# Patient Record
Sex: Female | Born: 1970
Health system: Southern US, Community
[De-identification: ages and names within clinical notes are randomized; demographics above are authoritative.]

## PROBLEM LIST (undated history)

## (undated) DIAGNOSIS — I639 Cerebral infarction, unspecified: Secondary | ICD-10-CM

## (undated) DIAGNOSIS — J45909 Unspecified asthma, uncomplicated: Secondary | ICD-10-CM

## (undated) DIAGNOSIS — E119 Type 2 diabetes mellitus without complications: Secondary | ICD-10-CM

## (undated) HISTORY — DX: Cerebral infarction, unspecified: I63.9

## (undated) HISTORY — PX: LEEP: SHX91

## (undated) HISTORY — DX: Type 2 diabetes mellitus without complications: E11.9

---

## 1999-08-13 ENCOUNTER — Inpatient Hospital Stay (HOSPITAL_COMMUNITY): Admission: AD | Admit: 1999-08-13 | Discharge: 1999-08-13 | Payer: Self-pay | Admitting: Obstetrics and Gynecology

## 1999-08-13 ENCOUNTER — Encounter: Payer: Self-pay | Admitting: Obstetrics

## 1999-10-20 ENCOUNTER — Inpatient Hospital Stay (HOSPITAL_COMMUNITY): Admission: AD | Admit: 1999-10-20 | Discharge: 1999-10-20 | Payer: Self-pay | Admitting: *Deleted

## 1999-10-21 ENCOUNTER — Inpatient Hospital Stay (HOSPITAL_COMMUNITY): Admission: AD | Admit: 1999-10-21 | Discharge: 1999-10-23 | Payer: Self-pay | Admitting: Obstetrics & Gynecology

## 2000-06-08 ENCOUNTER — Emergency Department (HOSPITAL_COMMUNITY): Admission: EM | Admit: 2000-06-08 | Discharge: 2000-06-09 | Payer: Self-pay | Admitting: Emergency Medicine

## 2000-10-29 ENCOUNTER — Other Ambulatory Visit: Admission: RE | Admit: 2000-10-29 | Discharge: 2000-10-29 | Payer: Self-pay | Admitting: Obstetrics and Gynecology

## 2005-05-06 ENCOUNTER — Emergency Department: Payer: Self-pay | Admitting: Unknown Physician Specialty

## 2005-05-06 ENCOUNTER — Other Ambulatory Visit: Payer: Self-pay

## 2005-05-26 ENCOUNTER — Emergency Department: Payer: Self-pay | Admitting: Unknown Physician Specialty

## 2006-03-29 ENCOUNTER — Inpatient Hospital Stay: Payer: Self-pay | Admitting: Obstetrics and Gynecology

## 2008-11-09 ENCOUNTER — Ambulatory Visit: Payer: Self-pay | Admitting: Certified Nurse Midwife

## 2008-11-23 ENCOUNTER — Emergency Department: Payer: Self-pay | Admitting: Emergency Medicine

## 2011-01-24 ENCOUNTER — Emergency Department: Payer: Self-pay | Admitting: Emergency Medicine

## 2011-11-18 ENCOUNTER — Emergency Department: Payer: Self-pay | Admitting: Emergency Medicine

## 2011-11-18 LAB — CBC WITH DIFFERENTIAL/PLATELET
Basophil #: 0.1 10*3/uL (ref 0.0–0.1)
Basophil %: 0.7 %
Eosinophil #: 0.1 10*3/uL (ref 0.0–0.7)
Eosinophil %: 0.7 %
HCT: 39.5 % (ref 35.0–47.0)
HGB: 12.6 g/dL (ref 12.0–16.0)
Lymphocyte #: 2.6 10*3/uL (ref 1.0–3.6)
Lymphocyte %: 18.9 %
MCH: 25.9 pg — ABNORMAL LOW (ref 26.0–34.0)
MCHC: 31.9 g/dL — ABNORMAL LOW (ref 32.0–36.0)
MCV: 81 fL (ref 80–100)
Monocyte #: 1.5 x10 3/mm — ABNORMAL HIGH (ref 0.2–0.9)
Monocyte %: 11.2 %
Neutrophil #: 9.3 10*3/uL — ABNORMAL HIGH (ref 1.4–6.5)
Neutrophil %: 68.5 %
Platelet: 295 10*3/uL (ref 150–440)
RBC: 4.88 10*6/uL (ref 3.80–5.20)
RDW: 12.5 % (ref 11.5–14.5)
WBC: 13.6 10*3/uL — ABNORMAL HIGH (ref 3.6–11.0)

## 2011-11-18 LAB — BASIC METABOLIC PANEL
Anion Gap: 7 (ref 7–16)
BUN: 8 mg/dL (ref 7–18)
Calcium, Total: 8.8 mg/dL (ref 8.5–10.1)
Chloride: 101 mmol/L (ref 98–107)
Co2: 26 mmol/L (ref 21–32)
Creatinine: 1.13 mg/dL (ref 0.60–1.30)
EGFR (African American): >60
EGFR (Non-African Amer.): >60
Glucose: 103 mg/dL — ABNORMAL HIGH (ref 65–99)
Osmolality: 267 (ref 275–301)
Potassium: 3.8 mmol/L (ref 3.5–5.1)
Sodium: 134 mmol/L — ABNORMAL LOW (ref 136–145)

## 2011-11-18 LAB — PREGNANCY, URINE: Pregnancy Test, Urine: NEGATIVE m[IU]/mL

## 2012-11-08 ENCOUNTER — Ambulatory Visit: Payer: Self-pay

## 2012-11-26 ENCOUNTER — Ambulatory Visit: Payer: Self-pay

## 2015-09-26 ENCOUNTER — Emergency Department: Payer: 59

## 2015-09-26 ENCOUNTER — Emergency Department
Admission: EM | Admit: 2015-09-26 | Discharge: 2015-09-26 | Disposition: A | Discharge status: Home / Self Care | Payer: 59 | Attending: Emergency Medicine | Admitting: Emergency Medicine

## 2015-09-26 ENCOUNTER — Encounter: Payer: Self-pay | Admitting: Emergency Medicine

## 2015-09-26 DIAGNOSIS — Y9389 Activity, other specified: Secondary | ICD-10-CM | POA: Insufficient documentation

## 2015-09-26 DIAGNOSIS — Y92410 Unspecified street and highway as the place of occurrence of the external cause: Secondary | ICD-10-CM | POA: Insufficient documentation

## 2015-09-26 DIAGNOSIS — S39012A Strain of muscle, fascia and tendon of lower back, initial encounter: Secondary | ICD-10-CM

## 2015-09-26 DIAGNOSIS — Y999 Unspecified external cause status: Secondary | ICD-10-CM | POA: Insufficient documentation

## 2015-09-26 DIAGNOSIS — M545 Low back pain: Secondary | ICD-10-CM | POA: Diagnosis present

## 2015-09-26 LAB — POCT PREGNANCY, URINE: Preg Test, Ur: NEGATIVE

## 2015-09-26 MED ORDER — IBUPROFEN 800 MG PO TABS: 800.0000 mg | ORAL_TABLET | Freq: Three times a day (TID) | ORAL | Status: DC | PRN

## 2015-09-26 MED ORDER — CYCLOBENZAPRINE HCL 5 MG PO TABS: 5.0000 mg | ORAL_TABLET | Freq: Three times a day (TID) | ORAL | Status: DC | PRN
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
## 2015-09-26 NOTE — Discharge Instructions (Signed)
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
## 2015-10-02 ENCOUNTER — Ambulatory Visit
Admission: EM | Admit: 2015-10-02 | Discharge: 2015-10-02 | Disposition: A | Discharge status: Home / Self Care | Payer: 59 | Attending: Family Medicine | Admitting: Family Medicine

## 2015-10-02 ENCOUNTER — Encounter: Payer: Self-pay | Admitting: *Deleted

## 2015-10-02 DIAGNOSIS — S338XXA Sprain of other parts of lumbar spine and pelvis, initial encounter: Secondary | ICD-10-CM

## 2015-10-02 DIAGNOSIS — S39012D Strain of muscle, fascia and tendon of lower back, subsequent encounter: Secondary | ICD-10-CM

## 2015-10-02 HISTORY — DX: Unspecified asthma, uncomplicated: J45.909
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
## 2015-10-02 NOTE — Discharge Instructions (Signed)
Stretch well, avoid reinjury.   Follow up with your primary care physician this week as needed. Return to Urgent care for new or worsening concerns.

## 2018-04-09 ENCOUNTER — Other Ambulatory Visit: Payer: Self-pay

## 2018-04-09 ENCOUNTER — Inpatient Hospital Stay
Admission: EM | Admit: 2018-04-09 | Discharge: 2018-04-10 | DRG: 066 | Disposition: A | Discharge status: Home / Self Care | Payer: Medicaid Other | Attending: Internal Medicine | Admitting: Internal Medicine

## 2018-04-09 ENCOUNTER — Emergency Department: Payer: Medicaid Other

## 2018-04-09 ENCOUNTER — Encounter: Payer: Self-pay | Admitting: Emergency Medicine

## 2018-04-09 DIAGNOSIS — R29700 NIHSS score 0: Secondary | ICD-10-CM | POA: Diagnosis present

## 2018-04-09 DIAGNOSIS — R29810 Facial weakness: Secondary | ICD-10-CM | POA: Diagnosis present

## 2018-04-09 DIAGNOSIS — Z23 Encounter for immunization: Secondary | ICD-10-CM | POA: Diagnosis not present

## 2018-04-09 DIAGNOSIS — I639 Cerebral infarction, unspecified: Secondary | ICD-10-CM | POA: Diagnosis present

## 2018-04-09 DIAGNOSIS — Z880 Allergy status to penicillin: Secondary | ICD-10-CM | POA: Diagnosis not present

## 2018-04-09 DIAGNOSIS — R739 Hyperglycemia, unspecified: Secondary | ICD-10-CM | POA: Diagnosis present

## 2018-04-09 DIAGNOSIS — J45909 Unspecified asthma, uncomplicated: Secondary | ICD-10-CM | POA: Diagnosis present

## 2018-04-09 DIAGNOSIS — R4701 Aphasia: Secondary | ICD-10-CM | POA: Diagnosis present

## 2018-04-09 DIAGNOSIS — Z79899 Other long term (current) drug therapy: Secondary | ICD-10-CM | POA: Diagnosis not present

## 2018-04-09 LAB — TROPONIN I: Troponin I: <0.03 ng/mL (ref <0.03)

## 2018-04-09 LAB — CBC
HEMATOCRIT: 43.3 % (ref 36.0–46.0)
HEMOGLOBIN: 13.7 g/dL (ref 12.0–15.0)
MCH: 26.6 pg (ref 26.0–34.0)
MCHC: 31.6 g/dL (ref 30.0–36.0)
MCV: 84.1 fL (ref 80.0–100.0)
NRBC: 0 % (ref 0.0–0.2)
Platelets: 215 10*3/uL (ref 150–400)
RBC: 5.15 MIL/uL — ABNORMAL HIGH (ref 3.87–5.11)
RDW: 12.3 % (ref 11.5–15.5)
WBC: 11 10*3/uL — ABNORMAL HIGH (ref 4.0–10.5)

## 2018-04-09 LAB — DIFFERENTIAL
ABS IMMATURE GRANULOCYTES: 0.03 10*3/uL (ref 0.00–0.07)
BASOS ABS: 0 10*3/uL (ref 0.0–0.1)
Basophils Relative: 0 %
Eosinophils Absolute: 0.2 10*3/uL (ref 0.0–0.5)
Eosinophils Relative: 2 %
IMMATURE GRANULOCYTES: 0 %
LYMPHS ABS: 5 10*3/uL — AB (ref 0.7–4.0)
LYMPHS PCT: 45 %
Monocytes Absolute: 1.1 10*3/uL — ABNORMAL HIGH (ref 0.1–1.0)
Monocytes Relative: 10 %
NEUTROS ABS: 4.7 10*3/uL (ref 1.7–7.7)
Neutrophils Relative %: 43 %

## 2018-04-09 LAB — COMPREHENSIVE METABOLIC PANEL
ALBUMIN: 3.9 g/dL (ref 3.5–5.0)
ALK PHOS: 68 U/L (ref 38–126)
ALT: 13 U/L (ref 0–44)
AST: 18 U/L (ref 15–41)
Anion gap: 9 (ref 5–15)
BILIRUBIN TOTAL: 0.9 mg/dL (ref 0.3–1.2)
BUN: 10 mg/dL (ref 6–20)
CALCIUM: 8.8 mg/dL — AB (ref 8.9–10.3)
CO2: 24 mmol/L (ref 22–32)
CREATININE: 0.88 mg/dL (ref 0.44–1.00)
Chloride: 104 mmol/L (ref 98–111)
GFR calc Af Amer: >60 mL/min (ref >60)
GFR calc non Af Amer: >60 mL/min (ref >60)
GLUCOSE: 148 mg/dL — AB (ref 70–99)
Potassium: 3.8 mmol/L (ref 3.5–5.1)
SODIUM: 137 mmol/L (ref 135–145)
TOTAL PROTEIN: 8.1 g/dL (ref 6.5–8.1)

## 2018-04-09 LAB — PROTIME-INR
INR: 0.89
PROTHROMBIN TIME: 12 s (ref 11.4–15.2)

## 2018-04-09 LAB — GLUCOSE, CAPILLARY: Glucose-Capillary: 130 mg/dL — ABNORMAL HIGH (ref 70–99)

## 2018-04-09 LAB — APTT: aPTT: 31 seconds (ref 24–36)

## 2018-04-09 MED ORDER — ACETAMINOPHEN 650 MG RE SUPP: 650.0000 mg | RECTAL | Status: DC | PRN

## 2018-04-09 MED ORDER — VITAMIN D 25 MCG (1000 UNIT) PO TABS
1000.0000 [IU] | ORAL_TABLET | Freq: Every day | ORAL | Status: DC
  Administered 2018-04-10: 1000 [IU] via ORAL

## 2018-04-09 MED ORDER — ONDANSETRON HCL 4 MG/2ML IJ SOLN: 4.0000 mg | Freq: Four times a day (QID) | INTRAMUSCULAR | Status: DC | PRN

## 2018-04-09 MED ORDER — ACETAMINOPHEN 160 MG/5ML PO SOLN
650.0000 mg | ORAL | Status: DC | PRN
  Filled 2018-04-09: qty 20.3

## 2018-04-09 MED ORDER — INFLUENZA VAC SPLIT QUAD 0.5 ML IM SUSY
0.5000 mL | PREFILLED_SYRINGE | INTRAMUSCULAR | Status: AC
  Administered 2018-04-10: 0.5 mL via INTRAMUSCULAR
  Filled 2018-04-09: qty 0.5

## 2018-04-09 MED ORDER — ASPIRIN 81 MG PO CHEW
162.0000 mg | CHEWABLE_TABLET | Freq: Once | ORAL | Status: AC
  Administered 2018-04-09: 162 mg via ORAL
  Filled 2018-04-09: qty 2

## 2018-04-09 MED ORDER — PNEUMOCOCCAL VAC POLYVALENT 25 MCG/0.5ML IJ INJ
0.5000 mL | INJECTION | INTRAMUSCULAR | Status: AC
  Administered 2018-04-10: 0.5 mL via INTRAMUSCULAR
  Filled 2018-04-09: qty 0.5

## 2018-04-09 MED ORDER — ASPIRIN 81 MG PO CHEW
81.0000 mg | CHEWABLE_TABLET | Freq: Every day | ORAL | Status: DC
  Administered 2018-04-10: 81 mg via ORAL
  Filled 2018-04-09: qty 1

## 2018-04-09 MED ORDER — ACETAMINOPHEN 325 MG PO TABS: 650.0000 mg | ORAL_TABLET | ORAL | Status: DC | PRN

## 2018-04-09 MED ORDER — SENNOSIDES-DOCUSATE SODIUM 8.6-50 MG PO TABS: 1.0000 | ORAL_TABLET | Freq: Every evening | ORAL | Status: DC | PRN

## 2018-04-09 MED ORDER — BISACODYL 5 MG PO TBEC: 5.0000 mg | DELAYED_RELEASE_TABLET | Freq: Every day | ORAL | Status: DC | PRN

## 2018-04-09 MED ORDER — ENOXAPARIN SODIUM 40 MG/0.4ML ~~LOC~~ SOLN
40.0000 mg | SUBCUTANEOUS | Status: DC
  Administered 2018-04-09: 40 mg via SUBCUTANEOUS
  Filled 2018-04-09: qty 0.4

## 2018-04-09 MED ORDER — STROKE: EARLY STAGES OF RECOVERY BOOK
Freq: Once | Status: AC
  Administered 2018-04-09: 23:00:00

## 2018-04-09 NOTE — ED Notes (Signed)
First Nurse Note: facial droop and slurred speech that started 30 minutes PTA

## 2018-04-09 NOTE — ED Notes (Signed)
cbg 130 

## 2018-04-09 NOTE — ED Triage Notes (Signed)
PT c/o facial droop and slurred speech x30 min. LKW was aprox 1600.

## 2018-04-09 NOTE — ED Provider Notes (Addendum)
[facility] Emergency Department Provider Note ____________________________________________   First MD Initiated Contact with Patient 04/09/18 1632     (approximate)  I have reviewed the triage vital signs and the nursing notes.   HISTORY  Chief Complaint Facial Droop    HPI Stacie Flores is a 47 y.o. female with history of asthma who presents with difficulty speaking, acute onset at 4 PM today, lasting less than 30 minutes, and associated with a possible facial droop and with tingling around her mouth and face.  The patient states that after the symptoms started she went to a store and outs were somewhat noted that her face looked twisted.  She denies any weakness or numbness throughout the rest of her body.  She does report some headache.  She states earlier today she had sort of an out of body or foggy type sensation which resolved prior to the current symptoms.  No prior history of episodes like this.  Past Medical History:  Diagnosis Date  . Asthma     Patient Active Problem List   Diagnosis Date Noted  . Stroke [facility]) 04/09/2018    Past Surgical History:  Procedure Laterality Date  . CESAREAN SECTION    . LEEP      Prior to Admission medications   Medication Sig Start Date End Date Taking? Authorizing Provider  Ascorbic Acid (VITAMIN C) 1000 MG tablet Take 1,000 mg by mouth daily.   Yes [provider]  Biotin 1000 MCG tablet Take 1,000 mcg by mouth daily.   Yes [provider]  cholecalciferol (VITAMIN D3) 25 MCG (1000 UT) tablet Take 1,000 Units by mouth daily.   Yes [provider]    Allergies Penicillins  History reviewed. No pertinent family history.  Social History Social History   Tobacco Use  . Smoking status: Never Smoker  . Smokeless tobacco: Never Used  Substance Use Topics  . Alcohol use: No  . Drug use: No    Review of Systems  Constitutional: No fever. Eyes: No visual changes. ENT: No  sore throat. Cardiovascular: Denies chest pain. Respiratory: Denies shortness of breath. Gastrointestinal: No vomiting. Genitourinary: Negative for flank pain.  Musculoskeletal: Negative for back pain. Skin: Negative for rash. Neurological: Negative for focal weakness or numbness.   ____________________________________________   PHYSICAL EXAM:  VITAL SIGNS: ED Triage Vitals [04/09/18 1623]  Enc Vitals Group     BP (!) 142/101     Pulse Rate 78     Resp 16     Temp      Temp src      SpO2 95 %     Weight      Height      Head Circumference      Peak Flow      Pain Score 0     Pain Loc      Pain Edu?      Excl. in GC?     Constitutional: Alert and oriented.  Slightly anxious appearing but in no acute distress. Eyes: Conjunctivae are normal.  EOMI.  PERRLA. Head: Atraumatic. Nose: No congestion/rhinnorhea. Mouth/Throat: Mucous membranes are moist.   Neck: Normal range of motion.  Cardiovascular: Normal rate, regular rhythm. Grossly normal heart sounds.  Good peripheral circulation. Respiratory: Normal respiratory effort.  No retractions. Lungs CTAB. Gastrointestinal: No distention.  Musculoskeletal: Extremities warm and well perfused.  Neurologic:  Normal speech and language.  No facial droop or abnormal sensation to the face.  Motor and  sensory intact in all extremities.  Normal coordination.  Skin:  Skin is warm and dry. No rash noted. Psychiatric: Mood and affect are normal. Speech and behavior are normal.  ____________________________________________   LABS (all labs ordered are listed, but only abnormal results are displayed)  Labs Reviewed  CBC - Abnormal; Notable for the following components:      Result Value   WBC 11.0 (*)    RBC 5.15 (*)    All other components within normal limits  DIFFERENTIAL - Abnormal; Notable for the following components:   Lymphs Abs 5.0 (*)    Monocytes Absolute 1.1 (*)    All other components within normal limits    COMPREHENSIVE METABOLIC PANEL - Abnormal; Notable for the following components:   Glucose, Bld 148 (*)    Calcium 8.8 (*)    All other components within normal limits  GLUCOSE, CAPILLARY - Abnormal; Notable for the following components:   Glucose-Capillary 130 (*)    All other components within normal limits  PROTIME-INR  APTT  TROPONIN I  CBG MONITORING, ED  POC URINE PREG, ED   ____________________________________________  EKG  ED ECG REPORT I, Dionne Bucy, the attending physician, personally viewed and interpreted this ECG.  Date: 04/09/2018 EKG Time:  Rate:  Rhythm:  QRS Axis:  Intervals:  ST/T Wave abnormalities:  Narrative Interpretation:   ____________________________________________  RADIOLOGY  CT head: No ICH or other acute findings MRI brain: Small right insular acute infarct versus artifact ____________________________________________   PROCEDURES  Procedure(s) performed: No  Procedures  Critical Care performed: Yes  CRITICAL CARE Performed by: Dionne Bucy   Total critical care time: 20 minutes  Critical care time was exclusive of separately billable procedures and treating other patients.  Critical care was necessary to treat or prevent imminent or life-threatening deterioration.  Critical care was time spent personally by me on the following activities: development of treatment plan with patient and/or surrogate as well as nursing, discussions with consultants, evaluation of patient's response to treatment, examination of patient, obtaining history from patient or surrogate, ordering and performing treatments and interventions, ordering and review of laboratory studies, ordering and review of radiographic studies, pulse oximetry and re-evaluation of patient's condition.     ____________________________________________   INITIAL IMPRESSION / ASSESSMENT AND PLAN / ED COURSE  Pertinent labs & imaging results that were  available during my care of the patient were reviewed by me and considered in my medical decision making (see chart for details).  47 year old female with PMH as noted above presents with acute onset of difficulty speaking and a possible facial droop or "twisting" of her face which has now resolved.  No associated weakness or numbness in her extremities or other acute neurologic symptoms.  On exam, the patient is relatively well-appearing and her vital signs are normal except for slight hypertension.  Her neurologic exam is normal at this time.  Code stroke was activated from triage.  CT head is negative.  The patient is now undergoing tele-neurology evaluation and lab work-up.  Overall my suspicion for acute stroke is low given the patient's age, lack of risk factors, the symptoms limited to speech and face without a clear unilateral facial droop, and the fact that they have now resolved.  ----------------------------------------- 9:51 PM on 04/09/2018 -----------------------------------------  Tele-neurology advised that tPA was not indicated and recommended TIA work-up and possible admission.  I discussed this with the patient and given her overall low risk for stroke, we agreed to obtain an  MRI in the ED and if it was negative and the patient remained asymptomatic we could discharge her home.  However, the MRI does show a small acute lesion which appears to be an infarct in the right insula.  This certainly could be consistent with the patient's primary symptom of speech disturbance.  Given this finding, the patient will require admission for further work-up and risk stratification.  I signed the patient out to the hospitalist Dr. Marjie Skiff. ____________________________________________   FINAL CLINICAL IMPRESSION(S) / ED DIAGNOSES  Final diagnoses:  Cerebrovascular accident (CVA), unspecified mechanism (HCC)      NEW MEDICATIONS STARTED DURING THIS VISIT:  New Prescriptions   No  medications on file     Note:  This document was prepared using Dragon voice recognition software and may include unintentional dictation errors.    Dionne Bucy, MD 04/09/18 2152    Dionne Bucy, MD 04/27/18 1539

## 2018-04-09 NOTE — ED Notes (Signed)
Patient transported to MRI 

## 2018-04-09 NOTE — Consult Note (Signed)
[facility] TeleNeurology Consult Services   Date of Service:   04/09/2018 16:38:12  Impression:     .  RO Acute Ischemic Stroke  Comments: 47 year old female with transient slurred speech. Concern for possible TIA.  Mechanism of Stroke: Not Clear  Metrics: Last Known Well: 04/09/2018 15:45:00 [facility] Notification Time: 04/09/2018 16:38:12 Arrival Time: 04/09/2018 16:19:00 Stamp Time: 04/09/2018 16:38:12 Time First Login Attempt: 04/09/2018 16:47:00 Video Start Time: 04/09/2018 16:47:00  Symptoms: Slurred speech NIHSS Start Assessment Time: 04/09/2018 16:49:02 Patient is not a candidate for tPA. Patient was not deemed candidate for tPA thrombolytics because of NIHSS of 0. Video End Time: 04/09/2018 16:58:25  CT head was reviewed.  Advanced imaging was not obtained as the presentation was not suggestive of Large Vessel Occlusive Disease.   Radiologist was not called back for review of advanced imaging because Symptoms not consistent with LVO ER Physician notified of the decision on thrombolytics management on 04/09/2018 16:58:28  Our recommendations are outlined below.  Recommendations:     .  Activate Stroke Protocol Admission/Order Set     .  Stroke/Telemetry Floor     .  Neuro Checks     .  Bedside Swallow Eval     .  DVT Prophylaxis     .  IV Fluids, Normal Saline     .  Head of Bed Below 30 Degrees     .  Euglycemia and Avoid Hyperthermia (PRN Acetaminophen)     .  Antiplatelet Therapy Recommended  Routine Consultation with Inhouse Neurology for Follow up Care  Sign Out:     .  Discussed with Emergency Department Provider    ------------------------------------------------------------------------------  History of Present Illness: Patient is a 47 year old Female.  Patient was brought by EMS for symptoms of Slurred speech  47 year old female with no significant past medical history who presents with transient slurred  speech and right side weakness. Patient had just walked into a gas station and was trying to speak with the attendant and could not form her words correctly. She felt the right side of her face was numb and just did not feel right on the right side.  CT head was reviewed.    Examination: 1A: Level of Consciousness - Alert; keenly responsive + 0 1B: Ask Month and Age - Both Questions Right + 0 1C: Blink Eyes & Squeeze Hands - Performs Both Tasks + 0 2: Test Horizontal Extraocular Movements - Normal + 0 3: Test Visual Fields - No Visual Loss + 0 4: Test Facial Palsy (Use Grimace if Obtunded) - Normal symmetry + 0 5A: Test Left Arm Motor Drift - No Drift for 10 Seconds + 0 5B: Test Right Arm Motor Drift - No Drift for 10 Seconds + 0 6A: Test Left Leg Motor Drift - No Drift for 5 Seconds + 0 6B: Test Right Leg Motor Drift - No Drift for 5 Seconds + 0 7: Test Limb Ataxia (FNF/Heel-Shin) - No Ataxia + 0 8: Test Sensation - Normal; No sensory loss + 0 9: Test Language/Aphasia - Normal; No aphasia + 0 10: Test Dysarthria - Normal + 0 11: Test Extinction/Inattention - No abnormality + 0  NIHSS Score: 0  Patient was informed the Neurology Consult would happen via TeleHealth consult by way of interactive audio and video telecommunications and consented to receiving care in this manner.  Due to the immediate potential for life-threatening deterioration due to underlying acute neurologic illness, I spent 35 minutes  providing critical care. This time includes time for face to face visit via telemedicine, review of medical records, imaging studies and discussion of findings with providers, the patient and/or family.   Dr Joice Lofts   [facility] [phone]

## 2018-04-09 NOTE — ED Notes (Signed)
Admitting MD at bedside.

## 2018-04-10 ENCOUNTER — Inpatient Hospital Stay
Admit: 2018-04-10 | Discharge: 2018-04-10 | Disposition: A | Discharge status: Home / Self Care | Payer: Medicaid Other | Attending: Internal Medicine | Admitting: Internal Medicine

## 2018-04-10 ENCOUNTER — Inpatient Hospital Stay: Payer: Medicaid Other

## 2018-04-10 LAB — ECHOCARDIOGRAM COMPLETE
Height: 61 in
Weight: 2208 oz

## 2018-04-10 LAB — LIPID PANEL
CHOL/HDL RATIO: 2.8 ratio
CHOLESTEROL: 159 mg/dL (ref 0–200)
HDL: 56 mg/dL (ref >40)
LDL Cholesterol: 63 mg/dL (ref 0–99)
Triglycerides: 199 mg/dL — ABNORMAL HIGH (ref <150)
VLDL: 40 mg/dL (ref 0–40)

## 2018-04-10 LAB — HEMOGLOBIN A1C
Hgb A1c MFr Bld: 7 % — ABNORMAL HIGH (ref 4.8–5.6)
Mean Plasma Glucose: 154.2 mg/dL

## 2018-04-10 MED ORDER — ASPIRIN 81 MG PO CHEW: 81.0000 mg | CHEWABLE_TABLET | Freq: Every day | ORAL | 0 refills | Status: DC

## 2018-04-10 NOTE — Progress Notes (Signed)
Admitted for acute stroke on the right side, patient had left right facial droop as the main complaint.  Ultrasound of carotids, echocardiogram did not show any acute problem.  Stable for discharge, advised her to continue aspirin.  No focal neurological deficit today.  We will discharge the patient, discharge instructions on the computer.

## 2018-04-10 NOTE — Plan of Care (Signed)
  Problem: Education: Goal: Knowledge of General Education information will improve Description: Including pain rating scale, medication(s)/side effects and non-pharmacologic comfort measures Outcome: Progressing   Problem: Health Behavior/Discharge Planning: Goal: Ability to manage health-related needs will improve Outcome: Progressing   Problem: Clinical Measurements: Goal: Ability to maintain clinical measurements within normal limits will improve Outcome: Progressing Goal: Will remain free from infection Outcome: Progressing Goal: Diagnostic test results will improve Outcome: Progressing Goal: Respiratory complications will improve Outcome: Progressing Goal: Cardiovascular complication will be avoided Outcome: Progressing   Problem: Activity: Goal: Risk for activity intolerance will decrease Outcome: Progressing   Problem: Nutrition: Goal: Adequate nutrition will be maintained Outcome: Progressing   Problem: Coping: Goal: Level of anxiety will decrease Outcome: Progressing   Problem: Elimination: Goal: Will not experience complications related to bowel motility Outcome: Progressing Goal: Will not experience complications related to urinary retention Outcome: Progressing   Problem: Pain Managment: Goal: General experience of comfort will improve Outcome: Progressing   Problem: Safety: Goal: Ability to remain free from injury will improve Outcome: Progressing   Problem: Skin Integrity: Goal: Risk for impaired skin integrity will decrease Outcome: Progressing   Problem: Education: Goal: Knowledge of disease or condition will improve Outcome: Progressing Goal: Knowledge of secondary prevention will improve Outcome: Progressing Goal: Knowledge of patient specific risk factors addressed and post discharge goals established will improve Outcome: Progressing Goal: Individualized Educational Video(s) Outcome: Progressing   Problem: Coping: Goal: Will verbalize  positive feelings about self Outcome: Progressing Goal: Will identify appropriate support needs Outcome: Progressing   Problem: Health Behavior/Discharge Planning: Goal: Ability to manage health-related needs will improve Outcome: Progressing   Problem: Self-Care: Goal: Ability to participate in self-care as condition permits will improve Outcome: Progressing Goal: Verbalization of feelings and concerns over difficulty with self-care will improve Outcome: Progressing Goal: Ability to communicate needs accurately will improve Outcome: Progressing   Problem: Nutrition: Goal: Risk of aspiration will decrease Outcome: Progressing Goal: Dietary intake will improve Outcome: Progressing   Problem: Intracerebral Hemorrhage Tissue Perfusion: Goal: Complications of Intracerebral Hemorrhage will be minimized Outcome: Progressing   Problem: Ischemic Stroke/TIA Tissue Perfusion: Goal: Complications of ischemic stroke/TIA will be minimized Outcome: Progressing   

## 2018-04-10 NOTE — H&P (Signed)
[facility] [place] [facility]   PATIENT NAME: Stacie Flores    MR#:  [id number]  DATE OF BIRTH:  12-10-1970  DATE OF ADMISSION:  04/09/2018  PRIMARY CARE PHYSICIAN: Lyndon Code, MD   REQUESTING/REFERRING PHYSICIAN: Dionne Bucy, MD  CHIEF COMPLAINT:   Chief Complaint  Patient presents with  . Facial Droop    HISTORY OF PRESENT ILLNESS:  Stacie Flores  is a 47 y.o. female with a known history of asthma p/w transient neurological abnl/deficits. Endorses uneventful day. Went to gas station in afternoon. Was requesting someone fix her radiator. Was asked what kind of car she had, and was unable to answer properly. States she knew the answer in her head, but she could not get the words out. Expressive aphasia transient, < , resolved spontaneously. States she got in car and went to [employer] to see friend. Friend told her that her face was "twisted". Possible slurred speech, facial droop. Face "felt funny." Friend called EMS. Neurologically non-focal at the time of my exam. MRI brain (+) "Subcentimeter acute right insular infarct versus artifact."  PAST MEDICAL HISTORY:   Past Medical History:  Diagnosis Date  . Asthma     PAST SURGICAL HISTORY:   Past Surgical History:  Procedure Laterality Date  . CESAREAN SECTION    . LEEP      SOCIAL HISTORY:   Social History   Tobacco Use  . Smoking status: Never Smoker  . Smokeless tobacco: Never Used  Substance Use Topics  . Alcohol use: No    FAMILY HISTORY:  History reviewed. No pertinent family history.  DRUG ALLERGIES:   Allergies  Allergen Reactions  . Penicillins Hives    Has patient had a PCN reaction causing immediate rash, facial/tongue/throat swelling, SOB or lightheadedness with hypotension: Yes Has patient had a PCN reaction causing severe rash involving mucus membranes or skin necrosis: No Has patient had a PCN reaction that required hospitalization: No Has patient had a PCN  reaction occurring within the last 10 years: Yes If all of the above answers are "NO", then may proceed with Cephalosporin use.    REVIEW OF SYSTEMS:   Review of Systems  Constitutional: Negative for chills, diaphoresis, fever, malaise/fatigue and weight loss.  HENT: Negative for congestion, ear pain, hearing loss, nosebleeds, sinus pain, sore throat and tinnitus.   Eyes: Negative for blurred vision, double vision and photophobia.  Respiratory: Negative for cough, hemoptysis, sputum production, shortness of breath and wheezing.   Cardiovascular: Negative for chest pain, palpitations, orthopnea, claudication, leg swelling and PND.  Gastrointestinal: Negative for abdominal pain, blood in stool, constipation, diarrhea, heartburn, melena, nausea and vomiting.  Genitourinary: Negative for dysuria, frequency, hematuria and urgency.  Musculoskeletal: Negative for back pain, falls, joint pain, myalgias and neck pain.  Skin: Negative for itching and rash.  Neurological: Positive for sensory change and speech change. Negative for dizziness, tingling, tremors, focal weakness, seizures, loss of consciousness, weakness and headaches.  Psychiatric/Behavioral: Negative for memory loss. The patient does not have insomnia.    MEDICATIONS AT HOME:   Prior to Admission medications   Medication Sig Start Date End Date Taking? Authorizing Provider  Ascorbic Acid (VITAMIN C) 1000 MG tablet Take 1,000 mg by mouth daily.   Yes [provider]  Biotin 1000 MCG tablet Take 1,000 mcg by mouth daily.   Yes [provider]  cholecalciferol (VITAMIN D3) 25 MCG (1000 UT) tablet Take 1,000 Units by mouth daily.   Yes  [provider]      VITAL SIGNS:  Blood pressure (!) 101/58, pulse 69, temperature 98.5 F (36.9 C), temperature source Oral, resp. rate 16, height 5\' 1"  (1.549 m), weight 62.6 kg, last menstrual period 03/21/2018, SpO2 98 %.  PHYSICAL EXAMINATION:  Physical Exam    Constitutional: She is oriented to person, place, and time. She appears well-developed and well-nourished. She is active and cooperative.  Non-toxic appearance. She does not have a sickly appearance. She does not appear ill. No distress. She is not intubated.  HENT:  Head: Normocephalic and atraumatic.  Mouth/Throat: Oropharynx is clear and moist. No oropharyngeal exudate.  Eyes: Conjunctivae, EOM and lids are normal. No scleral icterus.  Neck: Neck supple. No JVD present. No thyromegaly present.  Cardiovascular: Normal rate, regular rhythm, S1 normal, S2 normal and normal heart sounds.  No extrasystoles are present. Exam reveals no gallop, no S3, no S4, no distant heart sounds and no friction rub.  No murmur heard. Pulmonary/Chest: Effort normal and breath sounds normal. No accessory muscle usage or stridor. No apnea, no tachypnea and no bradypnea. She is not intubated. No respiratory distress. She has no decreased breath sounds. She has no wheezes. She has no rhonchi. She has no rales.  Abdominal: Soft. She exhibits no distension. Bowel sounds are decreased. There is no tenderness. There is no rigidity, no rebound and no guarding.  Musculoskeletal: Normal range of motion. She exhibits no edema or tenderness.  Lymphadenopathy:    She has no cervical adenopathy.  Neurological: She is alert and oriented to person, place, and time. She has normal strength and normal reflexes. She is not disoriented. She displays normal reflexes. No cranial nerve deficit or sensory deficit. She exhibits normal muscle tone. Coordination normal.  5/5 strength in all extremities, preserved sensation to crude touch, CN/reflex/cerebellar exams WNL.  Skin: Skin is warm, dry and intact. No rash noted. She is not diaphoretic. No erythema.  Psychiatric: She has a normal mood and affect. Her speech is normal and behavior is normal. Judgment and thought content normal. Cognition and memory are normal.   LABORATORY PANEL:    CBC Recent Labs  Lab 04/09/18 1625  WBC 11.0*  HGB 13.7  HCT 43.3  PLT 215   ------------------------------------------------------------------------------------------------------------------  Chemistries  Recent Labs  Lab 04/09/18 1625  NA 137  K 3.8  CL 104  CO2 24  GLUCOSE 148*  BUN 10  CREATININE 0.88  CALCIUM 8.8*  AST 18  ALT 13  ALKPHOS 68  BILITOT 0.9   ------------------------------------------------------------------------------------------------------------------  Cardiac Enzymes Recent Labs  Lab 04/09/18 1625  TROPONINI <0.03   ------------------------------------------------------------------------------------------------------------------  RADIOLOGY:  Mr Brain Wo Contrast  Result Date: 04/09/2018 CLINICAL DATA:  Slurred speech and RIGHT-sided weakness at gas station. EXAM: MRI HEAD WITHOUT CONTRAST TECHNIQUE: Multiplanar, multiecho pulse sequences of the brain and surrounding structures were obtained without intravenous contrast. COMPARISON:  CT HEAD April 09, 2018. FINDINGS: INTRACRANIAL CONTENTS: Subcentimeter focus of reduced diffusion anterior RIGHT insula and low ADC values without corroborated signal abnormality. No susceptibility artifact to suggest hemorrhage. The ventricles and sulci are normal for patient's age. No suspicious parenchymal signal, masses, mass effect. No abnormal extra-axial fluid collections. No extra-axial masses. VASCULAR: Normal major intracranial vascular flow voids present at skull base. SKULL AND UPPER CERVICAL SPINE: No abnormal sellar expansion. No suspicious calvarial bone marrow signal. Craniocervical junction maintained. SINUSES/ORBITS: The mastoid air-cells and included paranasal sinuses are well-aerated.The included ocular globes and orbital contents are non-suspicious. OTHER: None. IMPRESSION:  1. Subcentimeter acute RIGHT insular infarct versus artifact. 2. Otherwise negative noncontrast MRI head. Electronically  Signed   By: Awilda Metro M.D.   On: 04/09/2018 19:51   Ct Head Code Stroke Wo Contrast  Result Date: 04/09/2018 CLINICAL DATA:  Code stroke. Facial droop and slurred speech for 30 minutes. EXAM: CT HEAD WITHOUT CONTRAST TECHNIQUE: Contiguous axial images were obtained from the base of the skull through the vertex without intravenous contrast. COMPARISON:  None. FINDINGS: Brain: No acute infarct, hemorrhage, or mass lesion is present. No significant white matter disease is present. Basal ganglia are intact. Insular ribbon is normal. The brainstem and cerebellum is within normal limits. There is no significant extra-axial fluid collection. Vascular: No hyperdense vessel or unexpected calcification. Skull: Calvarium is intact. No focal lytic or blastic lesions are present. Sinuses/Orbits: The paranasal sinuses and mastoid air cells are clear. ASPECTS [facility] Stroke Program Early CT Score) - Ganglionic level infarction (caudate, lentiform nuclei, internal capsule, insula, M1-M3 cortex): 7/7 - Supraganglionic infarction (M4-M6 cortex): 3/3 Total score (0-10 with 10 being normal): 10/10 IMPRESSION: 1. Normal CT of the head without contrast 2. ASPECTS is 10/10 These results were called by telephone at the time of interpretation on 04/09/2018 at 4:38 pm to Dr. Mayford Knife , who verbally acknowledged these results. Electronically Signed   By: Marin Roberts M.D.   On: 04/09/2018 16:38   IMPRESSION AND PLAN:   A/P: 68F w/ transient neurological abn/deficits, MRI (+) "Subcentimeter acute right insular infarct versus artifact." Hyperglycemia, hypocalcemia, leukocytosis. -Transient neurological abnl, acute CVA: Pt p/w 1d Hx transient aphasia, possible slurred speech/facialy droop. MRI brain performed in ED demonstrates "Subcentimeter acute right insular infarct versus artifact." CVA pathway, NIHSS, neuro checks. UJW11. A1c, lipids for risk stratification/modification. Carotid U/S, Echo pending. No P/T, O/T or  SLP services ordered. Lifelong non-smoker, (-) HTN. Continuous cardiac monitoring. -Hyperglycemia: HbA1c. -Hypocalcemia: Ionized calcium. -c/w home meds. -FEN/GI: Regular diet. -DVT PPx: Lovenox. -Code status: Full code. -Disposition: Admission, > 2 midnights.   All the records are reviewed and case discussed with ED provider. Management plans discussed with the patient, family and they are in agreement.  CODE STATUS: Full code.  TOTAL TIME TAKING CARE OF THIS PATIENT: 75 minutes.    Barbaraann Rondo M.D on 04/10/2018 at 2:20 AM  Between 7am to 6pm - Pager - [phone]  After 6pm go to [email or web address] - password [email or web address] [facility]  [facility] [place] [facility]  Office  [phone]  CC: Primary care physician; Lyndon Code, MD   Note: This dictation was prepared with Dragon dictation along with smaller phrase technology. Any transcriptional errors that result from this process are unintentional.

## 2018-04-12 LAB — CALCIUM, IONIZED: Calcium, Ionized, Serum: 4.8 mg/dL (ref 4.5–5.6)

## 2018-04-15 NOTE — Discharge Summary (Signed)
Stacie Flores, is a 47 y.o. female  DOB 11/23/1970  MRN [id number].  Admission date:  04/09/2018  Admitting Physician  Barbaraann RondoPrasanna Sridharan, MD  Discharge Date:  04/10/2018   Primary MD  Lyndon CodeKhan, Fozia M, MD  Recommendations for primary care physician for things to follow:   Follow-up with PCP in 1 week   Admission Diagnosis  Cerebrovascular accident (CVA), unspecified mechanism (HCC) [I63.9]   Discharge Diagnosis  Cerebrovascular accident (CVA), unspecified mechanism (HCC) [I63.9]    Active Problems:   Stroke [facility]HCC)      Past Medical History:  Diagnosis Date  . Asthma     Past Surgical History:  Procedure Laterality Date  . CESAREAN SECTION    . LEEP         History of present illness and  Hospital Course:     Kindly see H&P for history of present illness and admission details, please review complete Labs, Consult reports and Test reports for all details in brief  HPI  from the history and physical done on the day of admission 47 year old female with history of asthma came in because of facial droop, unable to get the words out.  Admitted for stroke evaluation.   Hospital Course  Transient left facial droop, expressive aphasia: Telemetry neurology recommended observation first stroke evaluation.  Initial CT head did not see acute changes, MRI of the brain performed in the emergency room showed subcentimeter acute right insular infarct versus artifact.  Patient admitted to stroke unit under observation, received aspirin, ultrasound of carotids, echocardiogram.  Patient LDL is 63, ultrasound did not show any hemodynamically significant stenosis, echocardiogram showed EF 50 to 55%.  Patient did not have further expressive aphasia or facial droop resolved by the time I saw the patient next day.  Discharged home with  aspirin.  No neurological deficit at discharge.     Discharge Condition: Stable   Follow UP  Follow-up Information    Lyndon CodeKhan, Fozia M, MD. Schedule an appointment as soon as possible for a visit.   Specialty:  Internal Medicine Why:  Can call PCP Monday and make the appointment. Contact information: [address] [place] [id number] [phone]             Discharge Instructions  and  Discharge Medications      Allergies as of 04/10/2018      Reactions   Penicillins Hives   Has patient had a PCN reaction causing immediate rash, facial/tongue/throat swelling, SOB or lightheadedness with hypotension: Yes Has patient had a PCN reaction causing severe rash involving mucus membranes or skin necrosis: No Has patient had a PCN reaction that required hospitalization: No Has patient had a PCN reaction occurring within the last 10 years: Yes If all of the above answers are "NO", then may proceed with Cephalosporin use.      Medication List    TAKE these medications   aspirin 81 MG chewable tablet Chew 1 tablet (81 mg total) by mouth daily.   Biotin 1000 MCG tablet Take 1,000 mcg by mouth daily.   cholecalciferol 25 MCG (1000 UT) tablet Commonly known as:  VITAMIN D3 Take 1,000 Units by mouth daily.   vitamin C 1000 MG tablet Take 1,000 mg by mouth daily.         Diet and Activity recommendation: See Discharge Instructions above   Consults obtained -tele neurology   Major procedures and Radiology Reports - PLEASE review detailed and final reports for all details, in brief -  Mr Brain Wo Contrast  Result Date: 04/09/2018 CLINICAL DATA:  Slurred speech and RIGHT-sided weakness at gas station. EXAM: MRI HEAD WITHOUT CONTRAST TECHNIQUE: Multiplanar, multiecho pulse sequences of the brain and surrounding structures were obtained without intravenous contrast. COMPARISON:  CT HEAD April 09, 2018. FINDINGS: INTRACRANIAL CONTENTS: Subcentimeter focus of  reduced diffusion anterior RIGHT insula and low ADC values without corroborated signal abnormality. No susceptibility artifact to suggest hemorrhage. The ventricles and sulci are normal for patient's age. No suspicious parenchymal signal, masses, mass effect. No abnormal extra-axial fluid collections. No extra-axial masses. VASCULAR: Normal major intracranial vascular flow voids present at skull base. SKULL AND UPPER CERVICAL SPINE: No abnormal sellar expansion. No suspicious calvarial bone marrow signal. Craniocervical junction maintained. SINUSES/ORBITS: The mastoid air-cells and included paranasal sinuses are well-aerated.The included ocular globes and orbital contents are non-suspicious. OTHER: None. IMPRESSION: 1. Subcentimeter acute RIGHT insular infarct versus artifact. 2. Otherwise negative noncontrast MRI head. Electronically Signed   By: Awilda Metro M.D.   On: 04/09/2018 19:51   US Carotid Bilateral (at [facility] Only)  Result Date: 04/10/2018 CLINICAL DATA:  Stroke. EXAM: BILATERAL CAROTID DUPLEX ULTRASOUND TECHNIQUE: Wallace Cullens scale imaging, color Doppler and duplex ultrasound were performed of bilateral carotid and vertebral arteries in the neck. COMPARISON:  None. FINDINGS: Criteria: Quantification of carotid stenosis is based on velocity parameters that correlate the residual internal carotid diameter with NASCET-based stenosis levels, using the diameter of the distal internal carotid lumen as the denominator for stenosis measurement. The following velocity measurements were obtained: RIGHT ICA: 86 cm/sec CCA: 138 cm/sec SYSTOLIC ICA/CCA RATIO:  0.6 ECA: 153 cm/sec LEFT ICA: 127 cm/sec CCA: 143 cm/sec SYSTOLIC ICA/CCA RATIO:  0.9 ECA: 112 cm/sec RIGHT CAROTID ARTERY: Right carotid arteries are patent without significant plaque or stenosis. Normal waveforms and velocities in the internal carotid artery. External carotid artery is patent with normal waveform. RIGHT VERTEBRAL ARTERY: Antegrade  flow and normal waveform in the right vertebral artery. LEFT CAROTID ARTERY: Left carotid arteries are patent without significant plaque or stenosis. External carotid artery is patent with normal waveform. Normal waveforms and velocities in the internal carotid artery. LEFT VERTEBRAL ARTERY: Antegrade flow and normal waveform in the left vertebral artery. IMPRESSION: Normal carotid artery duplex. Carotid arteries are patent without significant plaque or stenosis. Patent vertebral arteries. Electronically Signed   By: Richarda Overlie M.D.   On: 04/10/2018 09:55   Ct Head Code Stroke Wo Contrast  Result Date: 04/09/2018 CLINICAL DATA:  Code stroke. Facial droop and slurred speech for 30 minutes. EXAM: CT HEAD WITHOUT CONTRAST TECHNIQUE: Contiguous axial images were obtained from the base of the skull through the vertex without intravenous contrast. COMPARISON:  None. FINDINGS: Brain: No acute infarct, hemorrhage, or mass lesion is present. No significant white matter disease is present. Basal ganglia are intact. Insular ribbon is normal. The brainstem and cerebellum is within normal limits. There is no significant extra-axial fluid collection. Vascular: No hyperdense vessel or unexpected calcification. Skull: Calvarium is intact. No focal lytic or blastic lesions are present. Sinuses/Orbits: The paranasal sinuses and mastoid air cells are clear. ASPECTS [facility] Stroke Program Early CT Score) - Ganglionic level infarction (caudate, lentiform nuclei, internal capsule, insula, M1-M3 cortex): 7/7 - Supraganglionic infarction (M4-M6 cortex): 3/3 Total score (0-10 with 10 being normal): 10/10 IMPRESSION: 1. Normal CT of the head without contrast 2. ASPECTS is 10/10 These results were called by telephone at the time of interpretation on 04/09/2018 at 4:38 pm to Dr. Mayford Knife ,  who verbally acknowledged these results. Electronically Signed   By: Marin Roberts M.D.   On: 04/09/2018 16:38    Micro Results     No  results found for this or any previous visit (from the past 240 hour(s)).     Today   Subjective:   Stacie Flores today has no headache,no chest abdominal pain,no new weakness tingling or numbness, feels much better wants to go home today.   Objective:   Blood pressure 97/61, pulse 70, temperature 98.1 F (36.7 C), temperature source Oral, resp. rate 16, height 5\' 1"  (1.549 m), weight 62.6 kg, last menstrual period 03/21/2018, SpO2 98 %.  No intake or output data in the 24 hours ending 04/15/18 1439  Exam Awake Alert, Oriented x 3, No new F.N deficits, Normal affect [place].AT,PERRAL Supple Neck,No JVD, No cervical lymphadenopathy appriciated.  Symmetrical Chest wall movement, Good air movement bilaterally, CTAB RRR,No Gallops,Rubs or new Murmurs, No Parasternal Heave +ve B.Sounds, Abd Soft, Non tender, No organomegaly appriciated, No rebound -guarding or rigidity. No Cyanosis, Clubbing or edema, No new Rash or bruise  Data Review   CBC w Diff:  Lab Results  Component Value Date   WBC 11.0 (H) 04/09/2018   HGB 13.7 04/09/2018   HGB 12.6 11/18/2011   HCT 43.3 04/09/2018   HCT 39.5 11/18/2011   PLT 215 04/09/2018   PLT 295 11/18/2011   LYMPHOPCT 45 04/09/2018   LYMPHOPCT 18.9 11/18/2011   MONOPCT 10 04/09/2018   MONOPCT 11.2 11/18/2011   EOSPCT 2 04/09/2018   EOSPCT 0.7 11/18/2011   BASOPCT 0 04/09/2018   BASOPCT 0.7 11/18/2011    CMP:  Lab Results  Component Value Date   NA 137 04/09/2018   NA 134 (L) 11/18/2011   K 3.8 04/09/2018   K 3.8 11/18/2011   CL 104 04/09/2018   CL 101 11/18/2011   CO2 24 04/09/2018   CO2 26 11/18/2011   BUN 10 04/09/2018   BUN 8 11/18/2011   CREATININE 0.88 04/09/2018   CREATININE 1.13 11/18/2011   PROT 8.1 04/09/2018   ALBUMIN 3.9 04/09/2018   BILITOT 0.9 04/09/2018   ALKPHOS 68 04/09/2018   AST 18 04/09/2018   ALT 13 04/09/2018  .   Total Time in preparing paper work, data evaluation and todays exam - 35  minutes  Katha Hamming M.D on 04/10/2018 at 2:39 PM    Note: This dictation was prepared with Dragon dictation along with smaller phrase technology. Any transcriptional errors that result from this process are unintentional.

## 2018-04-26 ENCOUNTER — Ambulatory Visit: Payer: Medicaid Other | Admitting: Adult Health

## 2018-04-26 ENCOUNTER — Encounter: Payer: Self-pay | Admitting: Adult Health

## 2018-04-26 VITALS — BP 98/62 | HR 66 | Temp 98.3°F | Resp 16 | Ht 61.0 in | Wt 140.0 lb

## 2018-04-26 DIAGNOSIS — M79604 Pain in right leg: Secondary | ICD-10-CM

## 2018-04-26 DIAGNOSIS — R0683 Snoring: Secondary | ICD-10-CM

## 2018-04-26 DIAGNOSIS — M79605 Pain in left leg: Secondary | ICD-10-CM

## 2018-04-26 DIAGNOSIS — I639 Cerebral infarction, unspecified: Secondary | ICD-10-CM

## 2018-04-26 DIAGNOSIS — G4719 Other hypersomnia: Secondary | ICD-10-CM

## 2018-04-26 NOTE — Patient Instructions (Signed)

## 2018-04-26 NOTE — Progress Notes (Signed)
[facility] [address] [place], [place] [address]  Internal MEDICINE  Office Visit Note  Patient Name: Stacie Flores  [id number]  [id number]  Date of Service: 04/26/2018     Chief Complaint  Patient presents with  . Stroke Symptoms    hospital follow up     HPI Pt is here for recent hospital follow up. Pt reports on Nov 8th she had a stroke. She has no residual deficits at this time.  She reports the episode lasted a few minutes.  She had slurred speech and facial droop.  She went to [facility] Emergency department and was admitted.  She was discharged the next day, and told to take 81mg  aspirin daily.  She denies any further episodes.  No headaches, weakness, or visual disturbances. She also reports that she has loud snoring with excessive daytime fatigue/sleepiness.  She reports headaches at times when she wakes up.  She also states that she wakes up chocking and coughing in the middle of the night at times.    Current Medication: Outpatient Encounter Medications as of 04/26/2018  Medication Sig  . Ascorbic Acid (VITAMIN C) 1000 MG tablet Take 1,000 mg by mouth daily.  Marland Kitchen aspirin 81 MG chewable tablet Chew 1 tablet (81 mg total) by mouth daily.  . Biotin 1000 MCG tablet Take 1,000 mcg by mouth daily.  . cholecalciferol (VITAMIN D3) 25 MCG (1000 UT) tablet Take 1,000 Units by mouth daily.  . Flaxseed, Linseed, (GNP FLAX SEED OIL) 1000 MG CAPS Take by mouth.   No facility-administered encounter medications on file as of 04/26/2018.     Surgical History: Past Surgical History:  Procedure Laterality Date  . CESAREAN SECTION    . LEEP      Medical History: Past Medical History:  Diagnosis Date  . Asthma   . Stroke [facility])     Family History: History reviewed. No pertinent family history.  Social History   Socioeconomic History  . Marital status: Single    Spouse name: Not on file  . Number of children: Not on file  . Years of education: Not on file  .  Highest education level: Not on file  Occupational History  . Not on file  Social Needs  . Financial resource strain: Not hard at all  . Food insecurity:    Worry: Never true    Inability: Never true  . Transportation needs:    Medical: No    Non-medical: No  Tobacco Use  . Smoking status: Never Smoker  . Smokeless tobacco: Never Used  Substance and Sexual Activity  . Alcohol use: No  . Drug use: No  . Sexual activity: Yes  Lifestyle  . Physical activity:    Days per week: 5 days    Minutes per session: 20 min  . Stress: Not at all  Relationships  . Social connections:    Talks on phone: Twice a week    Gets together: Twice a week    Attends religious service: 1 to 4 times per year    Active member of club or organization: No    Attends meetings of clubs or organizations: Never    Relationship status: Divorced  . Intimate partner violence:    Fear of current or ex partner: No    Emotionally abused: No    Physically abused: No    Forced sexual activity: No  Other Topics Concern  . Not on file  Social History Narrative   Live with  family      Review of Systems  Constitutional: Negative for chills, fatigue and unexpected weight change.  HENT: Negative for congestion, rhinorrhea, sneezing and sore throat.   Eyes: Negative for photophobia, pain and redness.  Respiratory: Negative for cough, chest tightness and shortness of breath.   Cardiovascular: Negative for chest pain and palpitations.  Gastrointestinal: Negative for abdominal pain, constipation, diarrhea, nausea and vomiting.  Endocrine: Negative.   Genitourinary: Negative for dysuria and frequency.  Musculoskeletal: Negative for arthralgias, back pain, joint swelling and neck pain.  Skin: Negative for rash.  Allergic/Immunologic: Negative.   Neurological: Negative for tremors and numbness.  Hematological: Negative for adenopathy. Does not bruise/bleed easily.  Psychiatric/Behavioral: Negative for behavioral  problems and sleep disturbance. The patient is not nervous/anxious.     Vital Signs: BP 98/62   Pulse 66   Temp 98.3 F (36.8 C) (Oral)   Resp 16   Ht 5\' 1"  (1.549 m)   Wt 140 lb (63.5 kg)   SpO2 98%   BMI 26.45 kg/m    Physical Exam  Constitutional: She is oriented to person, place, and time. She appears well-developed and well-nourished. No distress.  HENT:  Head: Normocephalic and atraumatic.  Mouth/Throat: Oropharynx is clear and moist. No oropharyngeal exudate.  Eyes: Pupils are equal, round, and reactive to light. EOM are normal.  Neck: Normal range of motion. Neck supple. No JVD present. No tracheal deviation present. No thyromegaly present.  Cardiovascular: Normal rate, regular rhythm and normal heart sounds. Exam reveals no gallop and no friction rub.  No murmur heard. Pulmonary/Chest: Effort normal and breath sounds normal. No respiratory distress. She has no wheezes. She has no rales. She exhibits no tenderness.  Abdominal: Soft. There is no tenderness. There is no guarding.  Musculoskeletal: Normal range of motion.  Lymphadenopathy:    She has no cervical adenopathy.  Neurological: She is alert and oriented to person, place, and time. No cranial nerve deficit.  Skin: Skin is warm and dry. She is not diaphoretic.  Psychiatric: She has a normal mood and affect. Her behavior is normal. Judgment and thought content normal.  Nursing note and vitals reviewed.   Assessment/Plan: 1. Cerebrovascular accident (CVA), unspecified mechanism (HCC) No residual deficits.  Continue to monitor for symptoms or neurological changes.  Continue to take aspirin 81mg  Po daily.   2. Pain in both lower extremities Pts bilateral lower extremitis are have intermittent pain.  She reports it has not bothered her in the last few days.  She does report the pain is worse with prolonged standing.   3. Loud snoring Will order home sleep study to assess for OSA.  - Home sleep test  4.  Excessive daytime sleepiness - Home sleep test  General Counseling: [name] verbalizes understanding of the findings of todays visit and agrees with plan of treatment. I have discussed any further diagnostic evaluation that may be needed or ordered today. We also reviewed her medications today. she has been encouraged to call the office with any questions or concerns that should arise related to todays visit.   Orders Placed This Encounter  Procedures  . Home sleep test      I have reviewed all medical records from hospital follow up including radiology reports and consults from other physicians. Appropriate follow up diagnostics will be scheduled as needed. Patient/ Family understands the plan of treatment.  Time spent 30 minutes.   Blima LedgerAdam [place] AGNP-c Internal Medicine

## 2018-04-27 ENCOUNTER — Telehealth: Payer: Self-pay

## 2018-04-27 NOTE — Telephone Encounter (Signed)
Left a message and asked patient to call back and schedule home sleep study, patient has no insurance and the cost will be 300 and we can work out payment options so she can get the test done. Beth

## 2018-07-30 ENCOUNTER — Other Ambulatory Visit: Payer: Self-pay | Admitting: Adult Health

## 2019-02-15 ENCOUNTER — Ambulatory Visit: Payer: Self-pay | Admitting: Nurse Practitioner

## 2019-02-15 ENCOUNTER — Emergency Department
Admission: EM | Admit: 2019-02-15 | Discharge: 2019-02-15 | Disposition: A | Discharge status: Home / Self Care | Payer: Self-pay | Attending: Emergency Medicine | Admitting: Emergency Medicine

## 2019-02-15 ENCOUNTER — Other Ambulatory Visit: Payer: Self-pay

## 2019-02-15 ENCOUNTER — Encounter: Payer: Self-pay | Admitting: Nurse Practitioner

## 2019-02-15 ENCOUNTER — Encounter: Payer: Self-pay | Admitting: *Deleted

## 2019-02-15 VITALS — BP 120/74 | HR 91 | Temp 97.8°F | Resp 16 | Ht 61.0 in | Wt 121.0 lb

## 2019-02-15 DIAGNOSIS — Z79899 Other long term (current) drug therapy: Secondary | ICD-10-CM | POA: Insufficient documentation

## 2019-02-15 DIAGNOSIS — R300 Dysuria: Secondary | ICD-10-CM

## 2019-02-15 DIAGNOSIS — R739 Hyperglycemia, unspecified: Secondary | ICD-10-CM

## 2019-02-15 DIAGNOSIS — E119 Type 2 diabetes mellitus without complications: Secondary | ICD-10-CM

## 2019-02-15 DIAGNOSIS — R7301 Impaired fasting glucose: Secondary | ICD-10-CM

## 2019-02-15 DIAGNOSIS — E1165 Type 2 diabetes mellitus with hyperglycemia: Secondary | ICD-10-CM | POA: Insufficient documentation

## 2019-02-15 DIAGNOSIS — J45909 Unspecified asthma, uncomplicated: Secondary | ICD-10-CM | POA: Insufficient documentation

## 2019-02-15 DIAGNOSIS — Z7982 Long term (current) use of aspirin: Secondary | ICD-10-CM | POA: Insufficient documentation

## 2019-02-15 LAB — GLUCOSE, CAPILLARY
Glucose-Capillary: 382 mg/dL — ABNORMAL HIGH (ref 70–99)
Glucose-Capillary: 337 mg/dL — ABNORMAL HIGH (ref 70–99)
Glucose-Capillary: 256 mg/dL — ABNORMAL HIGH (ref 70–99)

## 2019-02-15 LAB — BASIC METABOLIC PANEL
Anion gap: 9 (ref 5–15)
BUN: 18 mg/dL (ref 6–20)
CO2: 25 mmol/L (ref 22–32)
Calcium: 9.4 mg/dL (ref 8.9–10.3)
Chloride: 97 mmol/L — ABNORMAL LOW (ref 98–111)
Creatinine, Ser: 0.81 mg/dL (ref 0.44–1.00)
GFR calc Af Amer: >60 mL/min (ref >60)
GFR calc non Af Amer: >60 mL/min (ref >60)
Glucose, Bld: 418 mg/dL — ABNORMAL HIGH (ref 70–99)
Potassium: 4 mmol/L (ref 3.5–5.1)
Sodium: 131 mmol/L — ABNORMAL LOW (ref 135–145)

## 2019-02-15 LAB — POCT URINALYSIS DIPSTICK
Bilirubin, UA: NEGATIVE
Blood, UA: NEGATIVE
Glucose, UA: POSITIVE — AB
Ketones, UA: NEGATIVE
Leukocytes, UA: NEGATIVE
Nitrite, UA: NEGATIVE
Protein, UA: NEGATIVE
Spec Grav, UA: 1.01 (ref 1.010–1.025)
Urobilinogen, UA: 0.2 E.U./dL
pH, UA: 6 (ref 5.0–8.0)

## 2019-02-15 LAB — HEPATIC FUNCTION PANEL
ALT: 24 U/L (ref 0–44)
AST: 18 U/L (ref 15–41)
Albumin: 3.7 g/dL (ref 3.5–5.0)
Alkaline Phosphatase: 67 U/L (ref 38–126)
Bilirubin, Direct: 0.2 mg/dL (ref 0.0–0.2)
Indirect Bilirubin: 1.1 mg/dL — ABNORMAL HIGH (ref 0.3–0.9)
Total Bilirubin: 1.3 mg/dL — ABNORMAL HIGH (ref 0.3–1.2)
Total Protein: 7.1 g/dL (ref 6.5–8.1)

## 2019-02-15 LAB — CBC
HCT: 43.5 % (ref 36.0–46.0)
Hemoglobin: 13.9 g/dL (ref 12.0–15.0)
MCH: 26.6 pg (ref 26.0–34.0)
MCHC: 32 g/dL (ref 30.0–36.0)
MCV: 83.2 fL (ref 80.0–100.0)
Platelets: 168 10*3/uL (ref 150–400)
RBC: 5.23 MIL/uL — ABNORMAL HIGH (ref 3.87–5.11)
RDW: 12 % (ref 11.5–15.5)
WBC: 12.1 10*3/uL — ABNORMAL HIGH (ref 4.0–10.5)
nRBC: 0 % (ref 0.0–0.2)

## 2019-02-15 LAB — POCT GLUCOSE (DEVICE FOR HOME USE): Glucose Fasting, POC: HIGH mg/dL (ref 70–99)

## 2019-02-15 MED ORDER — LIVING WELL WITH DIABETES BOOK
Freq: Once | Status: AC
  Administered 2019-02-15: 21:00:00
  Filled 2019-02-15: qty 1

## 2019-02-15 MED ORDER — SODIUM CHLORIDE 0.9 % IV BOLUS
1000.0000 mL | Freq: Once | INTRAVENOUS | Status: AC
  Administered 2019-02-15: 1000 mL via INTRAVENOUS

## 2019-02-15 MED ORDER — METFORMIN HCL 500 MG PO TABS: 500.0000 mg | ORAL_TABLET | Freq: Two times a day (BID) | ORAL | 0 refills | Status: DC

## 2019-02-15 MED ORDER — METFORMIN HCL 500 MG PO TABS
500.0000 mg | ORAL_TABLET | Freq: Once | ORAL | Status: AC
  Administered 2019-02-15: 500 mg via ORAL
  Filled 2019-02-15: qty 1

## 2019-02-15 NOTE — ED Notes (Signed)
Pt called in the [facility] with no response will reattempt in 5 min

## 2019-02-15 NOTE — ED Notes (Signed)
Urinalysis results in computer.

## 2019-02-15 NOTE — ED Triage Notes (Signed)
Pt reports having been seen for frequent urination. No UTI but pts CBG was too high to read. Pt sent to ED because she has no hx of being a diabetic. Pt has lost 20 lbs since May is feeling weak and is reporting increased thirst.

## 2019-02-15 NOTE — ED Provider Notes (Signed)
[facility] Emergency Department Provider Note  ____________________________________________   First MD Initiated Contact with Patient 02/15/19 1800     (approximate)  I have reviewed the triage vital signs and the nursing notes.   HISTORY  Chief Complaint Hyperglycemia    HPI Stacie Flores is a 48 y.o. female with asthma, prior stroke who presents with hyperglycemia.  Patient was seen today for increased thirst and increased frequency of urination as well as weight loss with past few months. Constant, nothing makes it better or worse. She has lost about 20 pounds.  Per patient she had diagnosis of prediabetes.  She did have a stroke back in December 2019 and had an A1c done that was 7.  Patient has not been on any medications however.  She denies any vomiting, abdominal pain, fevers, dysuria.  Past Medical History:  Diagnosis Date  . Asthma   . Stroke [facility](HCC)     Patient Active Problem List   Diagnosis Date Noted  . IFG (impaired fasting glucose) 02/15/2019  . Type 2 diabetes mellitus with hyperglycemia, without long-term current use of insulin (HCC) 02/15/2019  . Dysuria 02/15/2019  . Stroke [facility](HCC) 04/09/2018    Past Surgical History:  Procedure Laterality Date  . CESAREAN SECTION    . LEEP      Prior to Admission medications   Medication Sig Start Date End Date Taking? Authorizing Provider  Ascorbic Acid (VITAMIN C) 1000 MG tablet Take 1,000 mg by mouth daily.    [provider]  aspirin 81 MG chewable tablet Chew 1 tablet (81 mg total) by mouth daily. 04/10/18   Katha HammingKonidena, Snehalatha, MD  Biotin 1000 MCG tablet Take 1,000 mcg by mouth daily.    [provider]  cholecalciferol (VITAMIN D3) 25 MCG (1000 UT) tablet Take 1,000 Units by mouth daily.    [provider]  Flaxseed, Linseed, (GNP FLAX SEED OIL) 1000 MG CAPS Take by mouth.    [provider]    Allergies Penicillins  History reviewed. No  pertinent family history.  Social History Social History   Tobacco Use  . Smoking status: Never Smoker  . Smokeless tobacco: Never Used  Substance Use Topics  . Alcohol use: No  . Drug use: No      Review of Systems Constitutional: Positive weight loss, no fever Eyes: No visual changes. ENT: No sore throat.  Increased thirst Cardiovascular: Denies chest pain. Respiratory: Denies shortness of breath. Gastrointestinal: No abdominal pain.  No nausea, no vomiting.  No diarrhea.  No constipation. Genitourinary: Negative for dysuria.  Increased urination Musculoskeletal: Negative for back pain. Skin: Negative for rash. Neurological: Negative for headaches, focal weakness or numbness. All other ROS negative ____________________________________________   PHYSICAL EXAM:  VITAL SIGNS: ED Triage Vitals  Enc Vitals Group     BP 02/15/19 1646 (!) 111/56     Pulse Rate 02/15/19 1646 73     Resp 02/15/19 1646 16     Temp 02/15/19 1646 99.1 F (37.3 C)     Temp Source 02/15/19 1646 Oral     SpO2 02/15/19 1646 97 %     Weight 02/15/19 1647 120 lb (54.4 kg)     Height 02/15/19 1647 5\' 1"  (1.549 m)     Head Circumference --      Peak Flow --      Pain Score 02/15/19 1647 0     Pain Loc --      Pain Edu? --  Excl. in [place]? --     Constitutional: Alert and oriented. Well appearing and in no acute distress. Eyes: Conjunctivae are normal. EOMI. Head: Atraumatic. Nose: No congestion/rhinnorhea. Mouth/Throat: Mucous membranes are moist.   Neck: No stridor. Trachea Midline. FROM Cardiovascular: Normal rate, regular rhythm. Grossly normal heart sounds.  Good peripheral circulation. Respiratory: Normal respiratory effort.  No retractions. Lungs CTAB. Gastrointestinal: Soft and nontender. No distention. No abdominal bruits.  Musculoskeletal: No lower extremity tenderness nor edema.  No joint effusions. Neurologic:  Normal speech and language. No gross focal neurologic deficits are  appreciated.  Skin:  Skin is warm, dry and intact. No rash noted. Psychiatric: Mood and affect are normal. Speech and behavior are normal. GU: Deferred   ____________________________________________   LABS (all labs ordered are listed, but only abnormal results are displayed)  Labs Reviewed  BASIC METABOLIC PANEL - Abnormal; Notable for the following components:      Result Value   Sodium 131 (*)    Chloride 97 (*)    Glucose, Bld 418 (*)    All other components within normal limits  CBC - Abnormal; Notable for the following components:   WBC 12.1 (*)    RBC 5.23 (*)    All other components within normal limits  GLUCOSE, CAPILLARY - Abnormal; Notable for the following components:   Glucose-Capillary 382 (*)    All other components within normal limits  HEMOGLOBIN A1C  CBG MONITORING, ED  POC URINE PREG, ED   ____________________________________________   ED ECG REPORT I, Vanessa [place], the attending physician, personally viewed and interpreted this ECG.  EKG is normal sinus rate of 62, no ST elevation, inverted T waves in 2, 3, aVF, V2 through V6.  Patient had no recent EKG.  10 years ago she had the inversions in V2 through V4.  Repeat ekg normal sinus rate of 68, similar t wave inversion, no st elevation, Qtc read as 642 but does not appear that long on EKG given less then 1/2 RR interval.   Repeat EKG confirms qtc normal,. No st elevation, similar to wave inversion, normal sinus  ____________________________________________ PROCEDURES  Procedure(s) performed (including Critical Care):  Procedures   ____________________________________________   INITIAL IMPRESSION / ASSESSMENT AND PLAN / ED COURSE  Stacie Flores was evaluated in Emergency Department on 02/15/2019 for the symptoms described in the history of present illness. She was evaluated in the context of the global COVID-19 pandemic, which necessitated consideration that the patient might be at risk for  infection with the SARS-CoV-2 virus that causes COVID-19. Institutional protocols and algorithms that pertain to the evaluation of patients at risk for COVID-19 are in a state of rapid change based on information released by regulatory bodies including the CDC and federal and state organizations. These policies and algorithms were followed during the patient's care in the ED.    Patient presents with evidence of diabetes.  Patient denies symptoms suggest DKA.  Will get labs to evaluate for electrolyte abnormalities, DKA.  She denies any infectious symptoms to suggest UTI, pneumonia.  No chest pain to suggest ACS but EKG was obtained in triage.  EKG did show some inverted T waves although she has had no prior EKG for over 10 years.  Will repeat EKG to ensure that it is stable but given she has no chest pain I have low suspicion this is secondary to ACS.  Glucose was noted to be 382.  Patient had no anion gap elevation therefore low suspicion  for DKA.  White count is slightly elevated but patient denies any infectious symptoms.  No evidence of anemia.  Patient given 1 L of fluid.  Repeat glucose  Added on A1c for patient to follow-up with outpatient.  Had a lengthy discussion with patient about diet as well as starting metformin and her need close follow-up for her elevated sugars.  Patient sugar is in the upper 300s.  Repeat sugar was 256.  D/w pt will start on metformin and f/u with PCP.  Discussed diet.   I discussed the provisional nature of ED diagnosis, the treatment so far, the ongoing plan of care, follow up appointments and return precautions with the patient and any family or support people present. They expressed understanding and agreed with the plan, discharged home.       ____________________________________________   FINAL CLINICAL IMPRESSION(S) / ED DIAGNOSES   Final diagnoses:  Hyperglycemia  Type 2 diabetes mellitus without complication, without long-term current use of  insulin (HCC)      MEDICATIONS GIVEN DURING THIS VISIT:  Medications  sodium chloride 0.9 % bolus 1,000 mL (0 mLs Intravenous Stopped 02/15/19 1842)  sodium chloride 0.9 % bolus 1,000 mL (0 mLs Intravenous Stopped 02/15/19 2030)  metFORMIN (GLUCOPHAGE) tablet 500 mg (500 mg Oral Given 02/15/19 2045)  living well with diabetes book MISC ( Does not apply Given 02/15/19 2046)     ED Discharge Orders         Ordered    metFORMIN (GLUCOPHAGE) 500 MG tablet  2 times daily with meals     02/15/19 1852           Note:  This document was prepared using Dragon voice recognition software and may include unintentional dictation errors.   Concha Se, MD 02/15/19 2051

## 2019-02-15 NOTE — Progress Notes (Signed)
[facility] [address] [place], [place] [id number]  Internal MEDICINE  Office Visit Note  Patient Name: Stacie BristleDwana L Flores  [id number]  [id number]  Date of Service: 02/15/2019   Pt is here for a sick visit.  Chief Complaint  Patient presents with  . Urinary Urgency  . Medical Management of Chronic Issues    Dry mouth ,try to do pt HbA1c is code error and also try to glucose is error and also try again glucometer said high is glucometer unable to give us number      The patient is here for sick visit. She is complaining of increased thirst and frequency of urination. She has also lost a great deal of weight over the past few months. She states she is very tired. She also states that she had a stroke in November and has been recovering since then. Today, we did u/a and was positive for >2000 glucose and no other signs of infection. We were unable to get blood sugar reading. Glucometer read high. HgbA1c is too high for our machine to read as well.        Current Medication:  Outpatient Encounter Medications as of 02/15/2019  Medication Sig  . Ascorbic Acid (VITAMIN C) 1000 MG tablet Take 1,000 mg by mouth daily.  Marland Kitchen. aspirin 81 MG chewable tablet Chew 1 tablet (81 mg total) by mouth daily.  . Biotin 1000 MCG tablet Take 1,000 mcg by mouth daily.  . cholecalciferol (VITAMIN D3) 25 MCG (1000 UT) tablet Take 1,000 Units by mouth daily.  . Flaxseed, Linseed, (GNP FLAX SEED OIL) 1000 MG CAPS Take by mouth.   No facility-administered encounter medications on file as of 02/15/2019.       Medical History: Past Medical History:  Diagnosis Date  . Asthma   . Stroke (HCC)      Today's Vitals   02/15/19 0829  BP: 120/74  Pulse: 91  Resp: 16  Temp: 97.8 F (36.6 C)  SpO2: 95%  Weight: 121 lb (54.9 kg)  Height: 5\' 1"  (1.549 m)   Body mass index is 22.86 kg/m.  Review of Systems  Constitutional: Positive for fatigue and unexpected weight change. Negative for  chills.       Weight loss of over 20 pounds since her last visit .  HENT: Negative for congestion, postnasal drip, rhinorrhea, sneezing and sore throat.   Respiratory: Negative for cough, chest tightness, shortness of breath and wheezing.   Cardiovascular: Negative for chest pain and palpitations.  Gastrointestinal: Positive for nausea. Negative for abdominal pain, constipation, diarrhea and vomiting.  Endocrine: Positive for polydipsia and polyuria. Negative for cold intolerance and heat intolerance.  Genitourinary: Positive for frequency and urgency. Negative for dysuria.  Musculoskeletal: Negative for arthralgias, back pain, joint swelling and neck pain.  Skin: Negative for rash.  Neurological: Negative for tremors and numbness.       History of stroke in 04/2018  Hematological: Negative for adenopathy. Does not bruise/bleed easily.  Psychiatric/Behavioral: Negative for behavioral problems (Depression), sleep disturbance and suicidal ideas. The patient is not nervous/anxious.     Physical Exam Vitals signs and nursing note reviewed.  Constitutional:      General: She is not in acute distress.    Appearance: Normal appearance. She is well-developed. She is not diaphoretic.  HENT:     Head: Normocephalic and atraumatic.     Mouth/Throat:     Pharynx: No oropharyngeal exudate.  Eyes:     Pupils: Pupils are  equal, round, and reactive to light.  Neck:     Musculoskeletal: Normal range of motion and neck supple.     Thyroid: No thyromegaly.     Vascular: No JVD.     Trachea: No tracheal deviation.  Cardiovascular:     Rate and Rhythm: Normal rate and regular rhythm.     Heart sounds: Normal heart sounds. No murmur. No friction rub. No gallop.   Pulmonary:     Effort: Pulmonary effort is normal. No respiratory distress.     Breath sounds: Normal breath sounds. No wheezing or rales.  Chest:     Chest wall: No tenderness.  Abdominal:     General: Bowel sounds are normal.      Palpations: Abdomen is soft.  Musculoskeletal: Normal range of motion.  Lymphadenopathy:     Cervical: No cervical adenopathy.  Skin:    General: Skin is warm and dry.  Neurological:     Mental Status: She is alert and oriented to person, place, and time.     Cranial Nerves: No cranial nerve deficit.  Psychiatric:        Behavior: Behavior normal.        Thought Content: Thought content normal.        Judgment: Judgment normal.    Assessment/Plan: 1. IFG (impaired fasting glucose) - POCT Glucose (Device for Home Use) glucose meter was unable to read blood sugar this morning. Twice we got a reading of "high."   2. Type 2 diabetes mellitus with hyperglycemia, without long-term current use of insulin (HCC) glucose meter was unable to read blood sugar this morning. Twice we got a reading of "high." Tried to check HgbA1c and also received error reading due to high value. Patient was sent to ER for further evaluation and acute management of new onset diabetes. Will follow up with her after stable and release from hospital.   3. Dysuria - POCT Urinalysis Dipstick showing >2000 glucose in urine.  General Counseling: [name] of the findings of todays visit and agrees with plan of treatment. I have discussed any further diagnostic evaluation that may be needed or ordered today. We also reviewed her medications today. she has been encouraged to call the office with any questions or concerns that should arise related to todays visit.    Counseling:  This patient was seen by Leretha Pol FNP Collaboration with Dr Lavera Guise as a part of collaborative care agreement  Orders Placed This Encounter  Procedures  . POCT Urinalysis Dipstick  . POCT Glucose (Device for Home Use)     Time spent: 25 Minutes

## 2019-02-15 NOTE — ED Notes (Addendum)
Reviewed discharge instructions, follow-up care, and medication changes with patient. All questions and concerns addressed prior to pt leaving ED. Pt to follow up with PCP this week or next per EDP.   Also reviewed the Living Well With Diabetes book with pt prior to leaving.

## 2019-02-15 NOTE — Discharge Instructions (Addendum)
You have a new diagnosis of diabetes.  We are going to start you on metformin 500 twice a day.  You most likely will need to go up on this.  You should call your primary doctor tomorrow.  Should follow-up with them by Friday or early next week to have your sugar rechecked.  They will potentially go up on her metformin versus add another agent on.  If you develop vomiting, fevers or any other symptoms you should return to the ER immediately.

## 2019-02-17 LAB — HEMOGLOBIN A1C
Hgb A1c MFr Bld: >15.5 % — ABNORMAL HIGH (ref 4.8–5.6)
Mean Plasma Glucose: >398 mg/dL

## 2019-02-18 ENCOUNTER — Encounter: Payer: Self-pay | Admitting: Emergency Medicine

## 2019-02-18 ENCOUNTER — Telehealth: Payer: Self-pay | Admitting: Emergency Medicine

## 2019-02-18 ENCOUNTER — Other Ambulatory Visit: Payer: Self-pay

## 2019-02-18 ENCOUNTER — Telehealth: Payer: Self-pay

## 2019-02-18 ENCOUNTER — Emergency Department
Admission: EM | Admit: 2019-02-18 | Discharge: 2019-02-19 | Disposition: A | Discharge status: Home / Self Care | Payer: Medicaid Other | Attending: Emergency Medicine | Admitting: Emergency Medicine

## 2019-02-18 DIAGNOSIS — Z7982 Long term (current) use of aspirin: Secondary | ICD-10-CM | POA: Insufficient documentation

## 2019-02-18 DIAGNOSIS — Z79899 Other long term (current) drug therapy: Secondary | ICD-10-CM | POA: Insufficient documentation

## 2019-02-18 DIAGNOSIS — E1165 Type 2 diabetes mellitus with hyperglycemia: Secondary | ICD-10-CM | POA: Insufficient documentation

## 2019-02-18 DIAGNOSIS — R739 Hyperglycemia, unspecified: Secondary | ICD-10-CM

## 2019-02-18 DIAGNOSIS — Z7984 Long term (current) use of oral hypoglycemic drugs: Secondary | ICD-10-CM | POA: Insufficient documentation

## 2019-02-18 LAB — CBC WITH DIFFERENTIAL/PLATELET
Abs Immature Granulocytes: 0.03 10*3/uL (ref 0.00–0.07)
Basophils Absolute: 0 10*3/uL (ref 0.0–0.1)
Basophils Relative: 0 %
Eosinophils Absolute: 0.1 10*3/uL (ref 0.0–0.5)
Eosinophils Relative: 1 %
HCT: 43.1 % (ref 36.0–46.0)
Hemoglobin: 13.9 g/dL (ref 12.0–15.0)
Immature Granulocytes: 0 %
Lymphocytes Relative: 44 %
Lymphs Abs: 4.8 10*3/uL — ABNORMAL HIGH (ref 0.7–4.0)
MCH: 26.3 pg (ref 26.0–34.0)
MCHC: 32.3 g/dL (ref 30.0–36.0)
MCV: 81.5 fL (ref 80.0–100.0)
Monocytes Absolute: 1.1 10*3/uL — ABNORMAL HIGH (ref 0.1–1.0)
Monocytes Relative: 11 %
Neutro Abs: 4.7 10*3/uL (ref 1.7–7.7)
Neutrophils Relative %: 44 %
Platelets: 172 10*3/uL (ref 150–400)
RBC: 5.29 MIL/uL — ABNORMAL HIGH (ref 3.87–5.11)
RDW: 12 % (ref 11.5–15.5)
WBC: 10.8 10*3/uL — ABNORMAL HIGH (ref 4.0–10.5)
nRBC: 0 % (ref 0.0–0.2)

## 2019-02-18 LAB — BASIC METABOLIC PANEL
Anion gap: 9 (ref 5–15)
BUN: 13 mg/dL (ref 6–20)
CO2: 24 mmol/L (ref 22–32)
Calcium: 9.3 mg/dL (ref 8.9–10.3)
Chloride: 96 mmol/L — ABNORMAL LOW (ref 98–111)
Creatinine, Ser: 0.7 mg/dL (ref 0.44–1.00)
GFR calc Af Amer: >60 mL/min (ref >60)
GFR calc non Af Amer: >60 mL/min (ref >60)
Glucose, Bld: 323 mg/dL — ABNORMAL HIGH (ref 70–99)
Potassium: 3.7 mmol/L (ref 3.5–5.1)
Sodium: 129 mmol/L — ABNORMAL LOW (ref 135–145)

## 2019-02-18 LAB — URINALYSIS, COMPLETE (UACMP) WITH MICROSCOPIC
Bacteria, UA: NONE SEEN
Bilirubin Urine: NEGATIVE
Glucose, UA: >=500 mg/dL — AB
Hgb urine dipstick: NEGATIVE
Ketones, ur: 5 mg/dL — AB
Leukocytes,Ua: NEGATIVE
Nitrite: NEGATIVE
Protein, ur: NEGATIVE mg/dL
Specific Gravity, Urine: 1.011 (ref 1.005–1.030)
pH: 5 (ref 5.0–8.0)

## 2019-02-18 LAB — BETA-HYDROXYBUTYRIC ACID: Beta-Hydroxybutyric Acid: 0.45 mmol/L — ABNORMAL HIGH (ref 0.05–0.27)

## 2019-02-18 LAB — POCT PREGNANCY, URINE: Preg Test, Ur: NEGATIVE

## 2019-02-18 LAB — GLUCOSE, CAPILLARY: Glucose-Capillary: 312 mg/dL — ABNORMAL HIGH (ref 70–99)

## 2019-02-18 NOTE — ED Provider Notes (Signed)
[facility] Emergency Department Provider Note   ____________________________________________   First MD Initiated Contact with Patient 02/18/19 2337     (approximate)  I have reviewed the triage vital signs and the nursing notes.   HISTORY  Chief Complaint Leg Pain    HPI Stacie Flores is a 48 y.o. female who returns to the ED at the referral of her PCP for hyperglycemia. Patient was seen in the ED on the 15th and diagnosed with diabetes, started on Metformin 500mg . Her PCP called her to tell her she has A1C of 15 and directed her to the ED for further evaluation. Patient states she is experiencing loose stools and muscle aches, particularly in her thighs since starting the medicine. Denies fever, cough, chest pain, shortness of breath, abdominal pain, nausea or vomiting.      Past Medical History:  Diagnosis Date  . Asthma   . Stroke [facility])     Patient Active Problem List   Diagnosis Date Noted  . IFG (impaired fasting glucose) 02/15/2019  . Type 2 diabetes mellitus with hyperglycemia, without long-term current use of insulin (HCC) 02/15/2019  . Dysuria 02/15/2019  . Stroke [facility]) 04/09/2018    Past Surgical History:  Procedure Laterality Date  . CESAREAN SECTION    . LEEP      Prior to Admission medications   Medication Sig Start Date End Date Taking? Authorizing Provider  Ascorbic Acid (VITAMIN C) 1000 MG tablet Take 1,000 mg by mouth daily.    [provider]  aspirin 81 MG chewable tablet Chew 1 tablet (81 mg total) by mouth daily. 04/10/18   Katha Hamming, MD  Biotin 1000 MCG tablet Take 1,000 mcg by mouth daily.    [provider]  cholecalciferol (VITAMIN D3) 25 MCG (1000 UT) tablet Take 1,000 Units by mouth daily.    [provider]  Flaxseed, Linseed, (GNP FLAX SEED OIL) 1000 MG CAPS Take by mouth.    [provider]  metFORMIN (GLUCOPHAGE) 500 MG tablet Take 1 tablet (500 mg total) by mouth  2 (two) times daily with a meal. 02/15/19 03/17/19  Concha Se, MD    Allergies Penicillins  No family history on file.  Social History Social History   Tobacco Use  . Smoking status: Never Smoker  . Smokeless tobacco: Never Used  Substance Use Topics  . Alcohol use: No  . Drug use: No    Review of Systems  Constitutional: No fever/chills Eyes: No visual changes. ENT: No sore throat. Cardiovascular: Denies chest pain. Respiratory: Denies shortness of breath. Gastrointestinal: No abdominal pain.  No nausea, no vomiting.  No diarrhea.  No constipation. Genitourinary: Negative for dysuria. Musculoskeletal: Negative for back pain. Skin: Negative for rash. Neurological: Negative for headaches, focal weakness or numbness. Endocrine: Positive for polydipsia, polyuria, weight loss. ____________________________________________   PHYSICAL EXAM:  VITAL SIGNS: ED Triage Vitals [02/18/19 2127]  Enc Vitals Group     BP (!) 116/51     Pulse Rate 88     Resp 18     Temp 98.8 F (37.1 C)     Temp Source Oral     SpO2 97 %     Weight 120 lb (54.4 kg)     Height 5\' 1"  (1.549 m)     Head Circumference      Peak Flow      Pain Score 0     Pain Loc      Pain Edu?  Excl. in GC?     Constitutional: Alert and oriented. Well appearing and in no acute distress. Eyes: Conjunctivae are normal. PERRL. EOMI. Head: Atraumatic. Nose: No congestion/rhinnorhea. Mouth/Throat: Mucous membranes are moist.  Oropharynx non-erythematous. Neck: No stridor.   Cardiovascular: Normal rate, regular rhythm. Grossly normal heart sounds.  Good peripheral circulation. Respiratory: Normal respiratory effort.  No retractions. Lungs CTAB. Gastrointestinal: Soft and nontender. No distention. No abdominal bruits. No CVA tenderness. Musculoskeletal: No lower extremity tenderness nor edema.  No joint effusions. Neurologic:  Normal speech and language. No gross focal neurologic deficits are  appreciated. No gait instability. Skin:  Skin is warm, dry and intact. No rash noted. Psychiatric: Mood and affect are normal. Speech and behavior are normal.  ____________________________________________   LABS (all labs ordered are listed, but only abnormal results are displayed)  Labs Reviewed  GLUCOSE, CAPILLARY - Abnormal; Notable for the following components:      Result Value   Glucose-Capillary 312 (*)    All other components within normal limits  URINALYSIS, COMPLETE (UACMP) WITH MICROSCOPIC - Abnormal; Notable for the following components:   Color, Urine STRAW (*)    APPearance CLEAR (*)    Glucose, UA >=500 (*)    Ketones, ur 5 (*)    All other components within normal limits  BETA-HYDROXYBUTYRIC ACID - Abnormal; Notable for the following components:   Beta-Hydroxybutyric Acid 0.45 (*)    All other components within normal limits  CBC WITH DIFFERENTIAL/PLATELET - Abnormal; Notable for the following components:   WBC 10.8 (*)    RBC 5.29 (*)    Lymphs Abs 4.8 (*)    Monocytes Absolute 1.1 (*)    All other components within normal limits  BASIC METABOLIC PANEL - Abnormal; Notable for the following components:   Sodium 129 (*)    Chloride 96 (*)    Glucose, Bld 323 (*)    All other components within normal limits  GLUCOSE, CAPILLARY - Abnormal; Notable for the following components:   Glucose-Capillary 229 (*)    All other components within normal limits  POCT PREGNANCY, URINE   ____________________________________________  EKG  None ____________________________________________  RADIOLOGY  ED MD interpretation:  None  Official radiology report(s): No results found.  ____________________________________________   PROCEDURES  Procedure(s) performed (including Critical Care):  Procedures   ____________________________________________   INITIAL IMPRESSION / ASSESSMENT AND PLAN / ED COURSE  As part of my medical decision making, I reviewed the  following data within the electronic MEDICAL RECORD NUMBER Nursing notes reviewed and incorporated, Labs reviewed, Old chart reviewed and Notes from prior ED visits     Stacie Flores was evaluated in Emergency Department on 02/19/2019 for the symptoms described in the history of present illness. She was evaluated in the context of the global COVID-19 pandemic, which necessitated consideration that the patient might be at risk for infection with the SARS-CoV-2 virus that causes COVID-19. Institutional protocols and algorithms that pertain to the evaluation of patients at risk for COVID-19 are in a state of rapid change based on information released by regulatory bodies including the CDC and federal and state organizations. These policies and algorithms were followed during the patient's care in the ED.    48 year old female newly diagnosed with diabetes who returns at the instruction of her PCP for further evaluation/possible admission.  Labs do not indicate patient in DKA. Lengthy discussion with patient who does not wish to be admitted. Turns out she has had some inadvertent dietary  indiscretions. Will infuse IV fluids, increase Metformin to 1000mg  twice daily and refer her to diabetes educator. Patient has an appointment with her PCP on Monday. Will reassess.   Clinical Course as of Feb 18 158  Sat Feb 19, 2019  0159 BS improved. Patient feels fine and still desires to go home. Strict return precautions given. Patient verbalizes understanding and agrees with plan of care.   [JS]    Clinical Course User Index [JS] Paulette Blanch, MD     ____________________________________________   FINAL CLINICAL IMPRESSION(S) / ED DIAGNOSES  Final diagnoses:  Hyperglycemia  Type 2 diabetes mellitus with hyperglycemia, without long-term current use of insulin [facility])     ED Discharge Orders         Ordered    Ambulatory referral to Nutrition and Diabetic Education     02/19/19 0013           Note:   This document was prepared using Dragon voice recognition software and may include unintentional dictation errors.   Paulette Blanch, MD 02/19/19 [phone]

## 2019-02-18 NOTE — ED Triage Notes (Signed)
Pt arrives POV to triage with c/o newly diagnosed diabetes. Pt was seen on the 15th for this and has been placed on metformin x3 days ago. Pt states that she feels funny since she started the metformin. Pt was also called today and told that her A1C is elevated to 15 at this time.

## 2019-02-18 NOTE — Telephone Encounter (Signed)
Spoke with pt as per heather need ED due to her glucometer is not ready number and her HbA1c is 15 and pt agree with is going to ED

## 2019-02-18 NOTE — Telephone Encounter (Addendum)
Called patient to ask about follow up plans as A1C was elevated.  I left a message about her available lab result and said to call me if she needs assistance with follow up.    She called me back.  Has appt with dr Humphrey Rolls on the 29th.  Says she is getting things to make her meter work today so she can keep log of sugar.  She is taking the meds we gave her.

## 2019-02-19 LAB — GLUCOSE, CAPILLARY: Glucose-Capillary: 229 mg/dL — ABNORMAL HIGH (ref 70–99)

## 2019-02-19 MED ORDER — SODIUM CHLORIDE 0.9 % IV BOLUS
1000.0000 mL | Freq: Once | INTRAVENOUS | Status: AC
  Administered 2019-02-19: 1000 mL via INTRAVENOUS

## 2019-02-19 NOTE — Discharge Instructions (Addendum)
Increase Metformin to 1000mg  twice daily. Return to the ER for worsening symptoms, persistent vomiting, difficulty breathing or other concerns.

## 2019-02-21 ENCOUNTER — Ambulatory Visit: Payer: Medicaid Other | Admitting: Adult Health

## 2019-03-01 ENCOUNTER — Other Ambulatory Visit: Payer: Self-pay

## 2019-03-01 ENCOUNTER — Ambulatory Visit: Payer: Medicaid Other | Admitting: Adult Health

## 2019-03-01 ENCOUNTER — Ambulatory Visit: Payer: Self-pay | Admitting: Adult Health

## 2019-03-01 ENCOUNTER — Encounter: Payer: Self-pay | Admitting: Adult Health

## 2019-03-01 VITALS — BP 126/82 | HR 77 | Resp 16 | Ht 61.0 in | Wt 130.0 lb

## 2019-03-01 DIAGNOSIS — I639 Cerebral infarction, unspecified: Secondary | ICD-10-CM

## 2019-03-01 DIAGNOSIS — Z23 Encounter for immunization: Secondary | ICD-10-CM

## 2019-03-01 DIAGNOSIS — E1165 Type 2 diabetes mellitus with hyperglycemia: Secondary | ICD-10-CM

## 2019-03-01 MED ORDER — METFORMIN HCL 1000 MG PO TABS: 1000.0000 mg | ORAL_TABLET | Freq: Two times a day (BID) | ORAL | 0 refills | Status: DC

## 2019-03-01 NOTE — Progress Notes (Addendum)
[facility] [address] [place], [place] [address]  Internal MEDICINE  Office Visit Note  Patient Name: Stacie Flores  [id number]  [id number]  Date of Service: 03/22/2019  Chief Complaint  Patient presents with  . Hospitalization Follow-up    er follow up , new diagnosis of diabetes a1c was 15.5     HPI  Pt is here for follow up from hospital.  She is newly diagnosed diabetic.  She went to the hospital and was found to have a A1C if 15.5.  She is currently taking Metformin 1000mg  twice daily. She reports her blood sugar has been much better.  She has even seen numbers as low as 112 (yesterday).     Current Medication: Outpatient Encounter Medications as of 03/01/2019  Medication Sig  . Ascorbic Acid (VITAMIN C) 1000 MG tablet Take 1,000 mg by mouth daily.  Marland Kitchen. aspirin 81 MG chewable tablet Chew 1 tablet (81 mg total) by mouth daily.  . Biotin 1000 MCG tablet Take 1,000 mcg by mouth daily.  . cholecalciferol (VITAMIN D3) 25 MCG (1000 UT) tablet Take 1,000 Units by mouth daily.  . Flaxseed, Linseed, (GNP FLAX SEED OIL) 1000 MG CAPS Take by mouth.  . metFORMIN (GLUCOPHAGE) 1000 MG tablet Take 1 tablet (1,000 mg total) by mouth 2 (two) times daily with a meal.  . [DISCONTINUED] metFORMIN (GLUCOPHAGE) 500 MG tablet Take 1 tablet (500 mg total) by mouth 2 (two) times daily with a meal.   No facility-administered encounter medications on file as of 03/01/2019.     Surgical History: Past Surgical History:  Procedure Laterality Date  . CESAREAN SECTION    . LEEP      Medical History: Past Medical History:  Diagnosis Date  . Asthma   . Stroke [facility](HCC)     Family History: History reviewed. No pertinent family history.  Social History   Socioeconomic History  . Marital status: Single    Spouse name: Not on file  . Number of children: Not on file  . Years of education: Not on file  . Highest education level: Not on file  Occupational History  . Not on file   Social Needs  . Financial resource strain: Not hard at all  . Food insecurity    Worry: Never true    Inability: Never true  . Transportation needs    Medical: No    Non-medical: No  Tobacco Use  . Smoking status: Never Smoker  . Smokeless tobacco: Never Used  Substance and Sexual Activity  . Alcohol use: No  . Drug use: No  . Sexual activity: Yes  Lifestyle  . Physical activity    Days per week: 5 days    Minutes per session: 20 min  . Stress: Not at all  Relationships  . Social Musicianconnections    Talks on phone: Twice a week    Gets together: Twice a week    Attends religious service: 1 to 4 times per year    Active member of club or organization: No    Attends meetings of clubs or organizations: Never    Relationship status: Divorced  . Intimate partner violence    Fear of current or ex partner: No    Emotionally abused: No    Physically abused: No    Forced sexual activity: No  Other Topics Concern  . Not on file  Social History Narrative   Live with family    Review of Systems  Constitutional: Negative  for chills, fatigue and unexpected weight change.  HENT: Negative for congestion, rhinorrhea, sneezing and sore throat.   Eyes: Negative for photophobia, pain and redness.  Respiratory: Negative for cough, chest tightness and shortness of breath.   Cardiovascular: Negative for chest pain and palpitations.  Gastrointestinal: Negative for abdominal pain, constipation, diarrhea, nausea and vomiting.  Endocrine: Negative.   Genitourinary: Negative for dysuria and frequency.  Musculoskeletal: Negative for arthralgias, back pain, joint swelling and neck pain.  Skin: Negative for rash.  Allergic/Immunologic: Negative.   Neurological: Negative for tremors and numbness.  Hematological: Negative for adenopathy. Does not bruise/bleed easily.  Psychiatric/Behavioral: Negative for behavioral problems and sleep disturbance. The patient is not nervous/anxious.     Vital  Signs: BP 126/82   Pulse 77   Resp 16   Ht 5\' 1"  (1.549 m)   Wt 130 lb (59 kg)   SpO2 98%   BMI 24.56 kg/m    Physical Exam Vitals signs and nursing note reviewed.  Constitutional:      General: She is not in acute distress.    Appearance: She is well-developed. She is not diaphoretic.  HENT:     Head: Normocephalic and atraumatic.     Mouth/Throat:     Pharynx: No oropharyngeal exudate.  Eyes:     Pupils: Pupils are equal, round, and reactive to light.  Neck:     Musculoskeletal: Normal range of motion and neck supple.     Thyroid: No thyromegaly.     Vascular: No JVD.     Trachea: No tracheal deviation.  Cardiovascular:     Rate and Rhythm: Normal rate and regular rhythm.     Heart sounds: Normal heart sounds. No murmur. No friction rub. No gallop.   Pulmonary:     Effort: Pulmonary effort is normal. No respiratory distress.     Breath sounds: Normal breath sounds. No wheezing or rales.  Chest:     Chest wall: No tenderness.  Abdominal:     Palpations: Abdomen is soft.     Tenderness: There is no abdominal tenderness. There is no guarding.  Musculoskeletal: Normal range of motion.  Lymphadenopathy:     Cervical: No cervical adenopathy.  Skin:    General: Skin is warm and dry.  Neurological:     Mental Status: She is alert and oriented to person, place, and time.     Cranial Nerves: No cranial nerve deficit.  Psychiatric:        Behavior: Behavior normal.        Thought Content: Thought content normal.        Judgment: Judgment normal.    Assessment/Plan: 1. Type 2 diabetes mellitus with hyperglycemia, without long-term current use of insulin (HCC) Continue metformin as discussed.  Spent signficicant time with patient discussed the importance of medication adherence.  We also had a lengthy discussion about carbohydrates in her diet as well as sugars.  She seemed receptive to dietary changes and appears to be compliant with her new diagnosis.  We discussed the  possibility of a nutritional consult specific to diabetes she would like to wait on that for now due to financial reasons.  We did discuss multiple websites where she could find information and she was receptive to this. - metFORMIN (GLUCOPHAGE) 1000 MG tablet; Take 1 tablet (1,000 mg total) by mouth 2 (two) times daily with a meal.  Dispense: 60 tablet; Refill: 0 - She will need further diagnostic work up for type1 or type 2 DM  2. Flu vaccine need - Flu Vaccine MDCK QUAD PF  3. Cerebrovascular accident (CVA), unspecified mechanism ([place]) Patient CVA in the past no issues at this time continue to monitor.  General Counseling: Arlita verbalizes understanding of the findings of todays visit and agrees with plan of treatment. I have discussed any further diagnostic evaluation that may be needed or ordered today. We also reviewed her medications today. she has been encouraged to call the office with any questions or concerns that should arise related to todays visit.    Orders Placed This Encounter  Procedures  . Flu Vaccine MDCK QUAD PF    Meds ordered this encounter  Medications  . metFORMIN (GLUCOPHAGE) 1000 MG tablet    Sig: Take 1 tablet (1,000 mg total) by mouth 2 (two) times daily with a meal.    Dispense:  60 tablet    Refill:  0    Time spent: 35 Minutes   This patient was seen by Orson Gear AGNP-C in Collaboration with Dr Lavera Guise as a part of collaborative care agreement     Kendell Bane AGNP-C Internal medicine

## 2019-04-05 ENCOUNTER — Other Ambulatory Visit: Payer: Self-pay | Admitting: Adult Health

## 2019-04-05 DIAGNOSIS — E1165 Type 2 diabetes mellitus with hyperglycemia: Secondary | ICD-10-CM

## 2019-04-25 ENCOUNTER — Telehealth: Payer: Self-pay

## 2019-04-25 NOTE — Telephone Encounter (Signed)
Tried contacting patient three times to confirm appointment, phone # not accepting calls at this time. klh

## 2019-04-27 ENCOUNTER — Other Ambulatory Visit: Payer: Self-pay

## 2019-04-27 ENCOUNTER — Encounter: Payer: Self-pay | Admitting: Adult Health

## 2019-04-27 ENCOUNTER — Ambulatory Visit: Payer: Self-pay | Admitting: Adult Health

## 2019-04-27 VITALS — BP 98/68 | HR 82 | Temp 97.7°F | Resp 16 | Ht 61.0 in | Wt 124.0 lb

## 2019-04-27 DIAGNOSIS — E1165 Type 2 diabetes mellitus with hyperglycemia: Secondary | ICD-10-CM

## 2019-04-27 LAB — POCT GLYCOSYLATED HEMOGLOBIN (HGB A1C): Hemoglobin A1C: 8.8 % — AB (ref 4.0–5.6)

## 2019-04-27 MED ORDER — METFORMIN HCL 1000 MG PO TABS: ORAL_TABLET | ORAL | 1 refills | Status: DC

## 2019-04-27 NOTE — Progress Notes (Signed)
[facility] [place], [place] [address]  Internal MEDICINE  Office Visit Note  Patient Name: Stacie Flores  [id number]  [id number]  Date of Service: 04/27/2019  Chief Complaint  Patient presents with  . Medical Management of Chronic Issues  . Diabetes  . Quality Metric Gaps    pt needs physical , urin micro , foot exam , pt does not have insurance     HPI Patient is here today for follow-up on A1C. Last A1C was >15.5, much improved today, down to 8.8. Being complaint with medication and following a strict diet. Applauded patient for her hard work and to keep up the good work. Discussed with patient importance of diet compliance as well as routine exercise. Daily exercise of walking 20-30 minutes at least 4 days a week as well as strength training. Patient complaining of hair loss and dry skin, feels it is related to metformin. Discussed with patient not making any changes to medication at this time and that hair loss and dry skin could have been related to severely elevated blood sugar values. Blurred vision from previous visit has much improved, back to baseline. Blood pressure remains stable. Denies chest pain, shortness of breath, headaches, polydipsia and polyuria. Scheduled for physical and lab slip provided to patient for annual lab check.    Current Medication: Outpatient Encounter Medications as of 04/27/2019  Medication Sig  . Ascorbic Acid (VITAMIN C) 1000 MG tablet Take 1,000 mg by mouth daily.  Marland Kitchen aspirin 81 MG chewable tablet Chew 1 tablet (81 mg total) by mouth daily.  . Biotin 1000 MCG tablet Take 1,000 mcg by mouth daily.  . cholecalciferol (VITAMIN D3) 25 MCG (1000 UT) tablet Take 1,000 Units by mouth daily.  . Flaxseed, Linseed, (GNP FLAX SEED OIL) 1000 MG CAPS Take by mouth.  . metFORMIN (GLUCOPHAGE) 1000 MG tablet TAKE ONE TABLET TWICE DAILY WITH A MEAL   No facility-administered encounter medications on file as of 04/27/2019.      Surgical History: Past Surgical History:  Procedure Laterality Date  . CESAREAN SECTION    . LEEP      Medical History: Past Medical History:  Diagnosis Date  . Asthma   . Stroke [facility])     Family History: History reviewed. No pertinent family history.  Social History   Socioeconomic History  . Marital status: Single    Spouse name: Not on file  . Number of children: Not on file  . Years of education: Not on file  . Highest education level: Not on file  Occupational History  . Not on file  Social Needs  . Financial resource strain: Not hard at all  . Food insecurity    Worry: Never true    Inability: Never true  . Transportation needs    Medical: No    Non-medical: No  Tobacco Use  . Smoking status: Never Smoker  . Smokeless tobacco: Never Used  Substance and Sexual Activity  . Alcohol use: No  . Drug use: No  . Sexual activity: Yes  Lifestyle  . Physical activity    Days per week: 5 days    Minutes per session: 20 min  . Stress: Not at all  Relationships  . Social Herbalist on phone: Twice a week    Gets together: Twice a week    Attends religious service: 1 to 4 times per year    Active member of club or organization: No  Attends meetings of clubs or organizations: Never    Relationship status: Divorced  . Intimate partner violence    Fear of current or ex partner: No    Emotionally abused: No    Physically abused: No    Forced sexual activity: No  Other Topics Concern  . Not on file  Social History Narrative   Live with family      Review of Systems  Constitutional: Negative for chills, fatigue and unexpected weight change.  HENT: Negative for congestion, rhinorrhea, sneezing and sore throat.   Eyes: Negative for photophobia, pain and redness.  Respiratory: Negative for cough, chest tightness and shortness of breath.   Cardiovascular: Negative for chest pain and palpitations.  Gastrointestinal: Negative for abdominal pain,  constipation, diarrhea, nausea and vomiting.  Endocrine: Negative.   Genitourinary: Negative for dysuria and frequency.  Musculoskeletal: Negative for arthralgias, back pain, joint swelling and neck pain.  Skin: Negative for rash.  Allergic/Immunologic: Negative.   Neurological: Negative for tremors and numbness.  Hematological: Negative for adenopathy. Does not bruise/bleed easily.  Psychiatric/Behavioral: Negative for behavioral problems and sleep disturbance. The patient is not nervous/anxious.     Vital Signs: BP 98/68   Pulse 82   Temp 97.7 F (36.5 C)   Resp 16   Ht  (1.549 m)   Wt 124 lb (56.2 kg)   SpO2 97%   BMI 23.43 kg/m    Physical Exam Vitals signs and nursing note reviewed.  Constitutional:      General: She is not in acute distress.    Appearance: She is well-developed. She is not diaphoretic.  HENT:     Head: Normocephalic and atraumatic.     Mouth/Throat:     Pharynx: No oropharyngeal exudate.  Eyes:     Pupils: Pupils are equal, round, and reactive to light.  Neck:     Musculoskeletal: Normal range of motion and neck supple.     Thyroid: No thyromegaly.     Vascular: No JVD.     Trachea: No tracheal deviation.  Cardiovascular:     Rate and Rhythm: Normal rate and regular rhythm.     Heart sounds: Normal heart sounds. No murmur. No friction rub. No gallop.   Pulmonary:     Effort: Pulmonary effort is normal. No respiratory distress.     Breath sounds: Normal breath sounds. No wheezing or rales.  Chest:     Chest wall: No tenderness.  Abdominal:     Palpations: Abdomen is soft.     Tenderness: There is no abdominal tenderness. There is no guarding.  Musculoskeletal: Normal range of motion.  Lymphadenopathy:     Cervical: No cervical adenopathy.  Skin:    General: Skin is warm and dry.  Neurological:     Mental Status: She is alert and oriented to person, place, and time.     Cranial Nerves: No cranial nerve deficit.  Psychiatric:         Behavior: Behavior normal.        Thought Content: Thought content normal.        Judgment: Judgment normal.     Assessment/Plan: 1. Uncontrolled type 2 diabetes mellitus with hyperglycemia (HCC) Much improved A1C since last visit, today 8.8. Encouraged patient to keep up the good work with dieting and to add routine exercise to further lower A1C. Monitor A1C closely due to rapid drop in 2 months period. Discussed symptoms of hypoglycemia and to monitor for low blood glucose episodes. - POCT  HgB A1C - metFORMIN (GLUCOPHAGE) 1000 MG tablet; TAKE ONE TABLET TWICE DAILY WITH A MEAL  Dispense: 60 tablet; Refill: 1   General Counseling: Van verbalizes understanding of the findings of todays visit and agrees with plan of treatment. I have discussed any further diagnostic evaluation that may be needed or ordered today. We also reviewed her medications today. she has been encouraged to call the office with any questions or concerns that should arise related to todays visit.    Orders Placed This Encounter  Procedures  . POCT HgB A1C    No orders of the defined types were placed in this encounter.   Time spent: 15 Minutes   This patient was seen by Blima Ledger AGNP-C in Collaboration with Dr Lyndon Code as a part of collaborative care agreement     Johnna Acosta AGNP-C Internal medicine

## 2019-07-11 ENCOUNTER — Other Ambulatory Visit: Payer: Self-pay | Admitting: Adult Health

## 2019-07-11 DIAGNOSIS — E1165 Type 2 diabetes mellitus with hyperglycemia: Secondary | ICD-10-CM

## 2019-07-28 ENCOUNTER — Telehealth: Payer: Self-pay

## 2019-07-28 NOTE — Telephone Encounter (Signed)
CONFIRMED AND SCREENED FOR 08-01-19 OV. 

## 2019-07-28 NOTE — Telephone Encounter (Signed)
Patient rescheduled appointment on 08/01/2019 to 08/26/2019 due to financial issues. klh

## 2019-08-01 ENCOUNTER — Ambulatory Visit: Payer: Medicaid Other | Admitting: Nurse Practitioner

## 2019-08-24 ENCOUNTER — Telehealth: Payer: Self-pay

## 2019-08-24 NOTE — Telephone Encounter (Signed)
CONFIRMED AND SCREENED FOR 08-26-19 OV. 

## 2019-08-26 ENCOUNTER — Other Ambulatory Visit: Payer: Self-pay

## 2019-08-26 ENCOUNTER — Encounter: Payer: Self-pay | Admitting: Nurse Practitioner

## 2019-08-26 ENCOUNTER — Ambulatory Visit: Payer: Self-pay | Admitting: Nurse Practitioner

## 2019-08-26 VITALS — BP 123/67 | HR 68 | Temp 97.7°F | Resp 16 | Ht 61.0 in | Wt 130.0 lb

## 2019-08-26 DIAGNOSIS — E1165 Type 2 diabetes mellitus with hyperglycemia: Secondary | ICD-10-CM

## 2019-08-26 LAB — POCT GLYCOSYLATED HEMOGLOBIN (HGB A1C): Hemoglobin A1C: 6.8 % — AB (ref 4.0–5.6)

## 2019-08-26 NOTE — Progress Notes (Deleted)
  [facility] [address] [place], [place] [address]  Internal MEDICINE  Office Visit Note  Patient Name: Stacie Flores  [id number]  [id number]  Date of Service: 08/26/2019   Pt is here for routine health maintenance examination  Chief Complaint  Patient presents with  . Medicare Wellness     The patient presents for health maintenance exam. Doing very well with diabetic control. Her HgbA1c has come down to 6.8 today. Continues to tolerate metformin well. The patient has history of CVA in 04/2018. She had carotid doppler done at that time, which was normal.     Current Medication: Outpatient Encounter Medications as of 08/26/2019  Medication Sig  . Ascorbic Acid (VITAMIN C) 1000 MG tablet Take 1,000 mg by mouth daily.  Marland Kitchen aspirin 81 MG chewable tablet Chew 1 tablet (81 mg total) by mouth daily.  . Biotin 1000 MCG tablet Take 1,000 mcg by mouth daily.  . cholecalciferol (VITAMIN D3) 25 MCG (1000 UT) tablet Take 1,000 Units by mouth daily.  . Flaxseed, Linseed, (GNP FLAX SEED OIL) 1000 MG CAPS Take by mouth.  . metFORMIN (GLUCOPHAGE) 1000 MG tablet TAKE ONE TABLET TWICE DAILY WITH A MEAL   No facility-administered encounter medications on file as of 08/26/2019.    Surgical History: Past Surgical History:  Procedure Laterality Date  . CESAREAN SECTION    . LEEP      Medical History: Past Medical History:  Diagnosis Date  . Asthma   . Stroke [facility])     Family History: History reviewed. No pertinent family history.     Review of Systems   Vital Signs: BP 123/67   Pulse 68   Temp 97.7 F (36.5 C)   Resp 16   Ht 5\' 1"  (1.549 m)   Wt 130 lb (59 kg)   SpO2 97%   BMI 24.56 kg/m    Physical Exam   LABS: Recent Results (from the past 2160 hour(s))  POCT HgB A1C     Status: Abnormal   Collection Time: 08/26/19 10:06 AM  Result Value Ref Range   Hemoglobin A1C 6.8 (A) 4.0 - 5.6 %   HbA1c POC (<> result, manual entry)     HbA1c, POC  (prediabetic range)     HbA1c, POC (controlled diabetic range)      .MAMM    Assessment/Plan:   General Counseling: Ada verbalizes understanding of the findings of todays visit and agrees with plan of treatment. I have discussed any further diagnostic evaluation that may be needed or ordered today. We also reviewed her medications today. she has been encouraged to call the office with any questions or concerns that should arise related to todays visit.    Counseling:    Orders Placed This Encounter  Procedures  . UA/M w/rflx Culture, Routine  . POCT HgB A1C    No orders of the defined types were placed in this encounter.   Total time spent:*** Minutes  Time spent includes review of chart, medications, test results, and follow up plan with the patient.     08/28/19, MD  Internal Medicine

## 2019-08-26 NOTE — Progress Notes (Signed)
[facility] [address] [place], [place] [address]  Internal MEDICINE  Office Visit Note  Patient Name: Stacie Flores  [id number]  [id number]  Date of Service: 09/10/2019  Chief Complaint  Patient presents with  . Diabetes  . Follow-up    The patient is here for routine follow up regarding diabetes. Her HgbA1c is 6.8 today, down from 8.8 at her last visit. She continues to be compliant with her medication as well as diet and lifestyle changes. She has no new concerns or complaints at this time.       Current Medication: Outpatient Encounter Medications as of 08/26/2019  Medication Sig  . Ascorbic Acid (VITAMIN C) 1000 MG tablet Take 1,000 mg by mouth daily.  Marland Kitchen aspirin 81 MG chewable tablet Chew 1 tablet (81 mg total) by mouth daily.  . Biotin 1000 MCG tablet Take 1,000 mcg by mouth daily.  . cholecalciferol (VITAMIN D3) 25 MCG (1000 UT) tablet Take 1,000 Units by mouth daily.  . Flaxseed, Linseed, (GNP FLAX SEED OIL) 1000 MG CAPS Take by mouth.  . metFORMIN (GLUCOPHAGE) 1000 MG tablet TAKE ONE TABLET TWICE DAILY WITH A MEAL   No facility-administered encounter medications on file as of 08/26/2019.    Surgical History: Past Surgical History:  Procedure Laterality Date  . CESAREAN SECTION    . LEEP      Medical History: Past Medical History:  Diagnosis Date  . Asthma   . Diabetes mellitus without complication (HCC)   . Stroke [facility])     Family History: History reviewed. No pertinent family history.  Social History   Socioeconomic History  . Marital status: Single    Spouse name: Not on file  . Number of children: Not on file  . Years of education: Not on file  . Highest education level: Not on file  Occupational History  . Not on file  Tobacco Use  . Smoking status: Never Smoker  . Smokeless tobacco: Never Used  Substance and Sexual Activity  . Alcohol use: No  . Drug use: No  . Sexual activity: Yes  Other Topics Concern  . Not on file   Social History Narrative   Live with family   Social Determinants of Health   Financial Resource Strain:   . Difficulty of Paying Living Expenses:   Food Insecurity:   . Worried About Programme researcher, broadcasting/film/video in the Last Year:   . Barista in the Last Year:   Transportation Needs:   . Freight forwarder (Medical):   Marland Kitchen Lack of Transportation (Non-Medical):   Physical Activity:   . Days of Exercise per Week:   . Minutes of Exercise per Session:   Stress:   . Feeling of Stress :   Social Connections:   . Frequency of Communication with Friends and Family:   . Frequency of Social Gatherings with Friends and Family:   . Attends Religious Services:   . Active Member of Clubs or Organizations:   . Attends Banker Meetings:   Marland Kitchen Marital Status:   Intimate Partner Violence:   . Fear of Current or Ex-Partner:   . Emotionally Abused:   Marland Kitchen Physically Abused:   . Sexually Abused:       Review of Systems  Constitutional: Negative for activity change, chills, fatigue and unexpected weight change.  HENT: Negative for congestion, postnasal drip, rhinorrhea, sneezing and sore throat.   Respiratory: Negative for cough, chest tightness, shortness of breath and  wheezing.   Cardiovascular: Negative for chest pain and palpitations.  Gastrointestinal: Negative for abdominal pain, constipation, diarrhea, nausea and vomiting.  Endocrine: Negative for cold intolerance, heat intolerance, polydipsia and polyuria.       Improving blood sugars  Musculoskeletal: Negative for arthralgias, back pain, joint swelling and neck pain.  Skin: Negative for rash.  Allergic/Immunologic: Negative for environmental allergies.  Neurological: Negative for dizziness, tremors, numbness and headaches.  Hematological: Negative for adenopathy. Does not bruise/bleed easily.  Psychiatric/Behavioral: Negative for behavioral problems (Depression), sleep disturbance and suicidal ideas. The patient is not  nervous/anxious.     Today's Vitals   08/26/19 1111  BP: 123/67  Pulse: 68  Resp: 16  Temp: 97.7 F (36.5 C)  SpO2: 97%  Weight: 130 lb (59 kg)  Height: 5\' 1"  (1.549 m)   Body mass index is 24.56 kg/m.  Physical Exam Vitals and nursing note reviewed.  Constitutional:      General: She is not in acute distress.    Appearance: Normal appearance. She is well-developed. She is not diaphoretic.  HENT:     Head: Normocephalic and atraumatic.     Nose: Nose normal.     Mouth/Throat:     Pharynx: No oropharyngeal exudate.  Eyes:     Pupils: Pupils are equal, round, and reactive to light.  Neck:     Thyroid: No thyromegaly.     Vascular: No JVD.     Trachea: No tracheal deviation.  Cardiovascular:     Rate and Rhythm: Normal rate and regular rhythm.     Heart sounds: Normal heart sounds. No murmur. No friction rub. No gallop.   Pulmonary:     Effort: Pulmonary effort is normal. No respiratory distress.     Breath sounds: Normal breath sounds. No wheezing or rales.  Chest:     Chest wall: No tenderness.  Abdominal:     Palpations: Abdomen is soft.  Musculoskeletal:        General: Tenderness present. Normal range of motion.     Cervical back: Normal range of motion and neck supple.  Lymphadenopathy:     Cervical: No cervical adenopathy.  Skin:    General: Skin is warm and dry.  Neurological:     General: No focal deficit present.     Mental Status: She is alert and oriented to person, place, and time.     Cranial Nerves: No cranial nerve deficit.  Psychiatric:        Behavior: Behavior normal.        Thought Content: Thought content normal.        Judgment: Judgment normal.    Assessment/Plan: 1. Type 2 diabetes mellitus with hyperglycemia, without long-term current use of insulin (HCC) - POCT HgB A1C 6.8 today. Continue with metformin and lifestyle changes to lower blood sugars.   General Counseling: Stacie Flores verbalizes understanding of the findings of todays visit  and agrees with plan of treatment. I have discussed any further diagnostic evaluation that may be needed or ordered today. We also reviewed her medications today. she has been encouraged to call the office with any questions or concerns that should arise related to todays visit.  Diabetes Counseling:  1. Addition of ACE inh/ ARB'S for nephroprotection. Microalbumin is updated  2. Diabetic foot care, prevention of complications. Podiatry consult 3. Exercise and lose weight.  4. Diabetic eye examination, Diabetic eye exam is updated  5. Monitor blood sugar closlely. nutrition counseling.  6. Sign and symptoms of hypoglycemia  including shaking sweating,confusion and headaches.  This patient was seen by Vincent Gros FNP Collaboration with Dr Lyndon Code as a part of collaborative care agreement  Orders Placed This Encounter  Procedures  . POCT HgB A1C      Total time spent: 20 Minutes   Time spent includes review of chart, medications, test results, and follow up plan with the patient.      Dr Lyndon Code Internal medicine

## 2019-09-10 LAB — POCT GLYCOSYLATED HEMOGLOBIN (HGB A1C): Hemoglobin A1C: 6.8 % — AB (ref 4.0–5.6)

## 2019-10-24 ENCOUNTER — Telehealth: Payer: Self-pay

## 2019-10-24 NOTE — Telephone Encounter (Signed)
Confirmed appointment on 10/26/2019 and screened for covid. klh 

## 2019-10-26 ENCOUNTER — Other Ambulatory Visit: Payer: Medicaid Other | Admitting: Adult Health

## 2019-10-28 ENCOUNTER — Other Ambulatory Visit: Payer: Medicaid Other | Admitting: Nurse Practitioner

## 2019-11-10 ENCOUNTER — Other Ambulatory Visit: Payer: Self-pay

## 2019-11-10 DIAGNOSIS — E1165 Type 2 diabetes mellitus with hyperglycemia: Secondary | ICD-10-CM

## 2019-11-10 MED ORDER — METFORMIN HCL 1000 MG PO TABS: ORAL_TABLET | ORAL | 3 refills | Status: DC

## 2019-11-18 ENCOUNTER — Telehealth: Payer: Self-pay

## 2019-11-18 NOTE — Telephone Encounter (Signed)
Tried informing patient of appointment on 11/22/2019 no voicemail. klh 

## 2019-11-22 ENCOUNTER — Ambulatory Visit: Payer: Self-pay | Admitting: Adult Health

## 2019-11-22 ENCOUNTER — Other Ambulatory Visit: Payer: Self-pay

## 2019-11-22 VITALS — BP 115/70 | HR 60 | Temp 97.7°F | Resp 16 | Ht 61.0 in | Wt 128.4 lb

## 2019-11-22 DIAGNOSIS — Z0001 Encounter for general adult medical examination with abnormal findings: Secondary | ICD-10-CM

## 2019-11-22 DIAGNOSIS — E1165 Type 2 diabetes mellitus with hyperglycemia: Secondary | ICD-10-CM

## 2019-11-22 DIAGNOSIS — I639 Cerebral infarction, unspecified: Secondary | ICD-10-CM

## 2019-11-22 LAB — POCT GLYCOSYLATED HEMOGLOBIN (HGB A1C): Hemoglobin A1C: 6.5 % — AB (ref 4.0–5.6)

## 2019-11-22 NOTE — Progress Notes (Signed)
[facility] [address] [place], [place] [address]  Internal MEDICINE  Office Visit Note  Patient Name: Stacie Flores  [id number]  [id number]  Date of Service: 11/22/2019  Chief Complaint  Patient presents with  . Annual Exam  . Diabetes     HPI Pt is here for routine health maintenance examination.  She is a well appearing 49 yo AA female.  She has a history of DM. She is currently working in [employer] at [employer]. She just started full time with them. She also had a CVA in years past. She has no residual deficits from the CVA.  She does have a slight speech impediment, that she reports started after the CVA.      Current Medication: Outpatient Encounter Medications as of 11/22/2019  Medication Sig  . Ascorbic Acid (VITAMIN C) 1000 MG tablet Take 1,000 mg by mouth daily.  Marland Kitchen aspirin 81 MG chewable tablet Chew 1 tablet (81 mg total) by mouth daily.  . Biotin 1000 MCG tablet Take 1,000 mcg by mouth daily.  . cholecalciferol (VITAMIN D3) 25 MCG (1000 UT) tablet Take 1,000 Units by mouth daily.  . Flaxseed, Linseed, (GNP FLAX SEED OIL) 1000 MG CAPS Take by mouth.  . metFORMIN (GLUCOPHAGE) 1000 MG tablet TAKE ONE TAB TWICE DAILY WITH A MEAL.   No facility-administered encounter medications on file as of 11/22/2019.    Surgical History: Past Surgical History:  Procedure Laterality Date  . CESAREAN SECTION    . LEEP      Medical History: Past Medical History:  Diagnosis Date  . Asthma   . Diabetes mellitus without complication (HCC)   . Stroke [facility])     Family History: No family history on file.    Review of Systems  Constitutional: Negative for chills, fatigue and unexpected weight change.  HENT: Negative for congestion, rhinorrhea, sneezing and sore throat.   Eyes: Negative for photophobia, pain and redness.  Respiratory: Negative for cough, chest tightness and shortness of breath.   Cardiovascular: Negative for chest pain and palpitations.   Gastrointestinal: Negative for abdominal pain, constipation, diarrhea, nausea and vomiting.  Endocrine: Negative.   Genitourinary: Negative for dysuria and frequency.  Musculoskeletal: Negative for arthralgias, back pain, joint swelling and neck pain.  Skin: Negative for rash.  Allergic/Immunologic: Negative.   Neurological: Negative for tremors and numbness.  Hematological: Negative for adenopathy. Does not bruise/bleed easily.  Psychiatric/Behavioral: Negative for behavioral problems and sleep disturbance. The patient is not nervous/anxious.      Vital Signs: BP 115/70   Pulse 60   Temp 97.7 F (36.5 C)   Resp 16   Ht 5\' 1"  (1.549 m)   Wt 128 lb 6.4 oz (58.2 kg)   SpO2 96%   BMI 24.26 kg/m    Physical Exam Vitals and nursing note reviewed.  Constitutional:      General: She is not in acute distress.    Appearance: She is well-developed. She is not diaphoretic.  HENT:     Head: Normocephalic and atraumatic.     Mouth/Throat:     Pharynx: No oropharyngeal exudate.  Eyes:     Pupils: Pupils are equal, round, and reactive to light.  Neck:     Thyroid: No thyromegaly.     Vascular: No JVD.     Trachea: No tracheal deviation.  Cardiovascular:     Rate and Rhythm: Normal rate and regular rhythm.     Heart sounds: Normal heart sounds. No murmur heard.  No friction rub. No gallop.   Pulmonary:     Effort: Pulmonary effort is normal. No respiratory distress.     Breath sounds: Normal breath sounds. No wheezing or rales.  Chest:     Chest wall: No tenderness.     Breasts:        Right: Normal.        Left: Normal.     Comments: Exam Chaperoned by Corlis Hove CMA Abdominal:     Palpations: Abdomen is soft.     Tenderness: There is no abdominal tenderness. There is no guarding.  Musculoskeletal:        General: Normal range of motion.     Cervical back: Normal range of motion and neck supple.  Lymphadenopathy:     Cervical: No cervical adenopathy.  Skin:     General: Skin is warm and dry.  Neurological:     Mental Status: She is alert and oriented to person, place, and time.     Cranial Nerves: No cranial nerve deficit.  Psychiatric:        Behavior: Behavior normal.        Thought Content: Thought content normal.        Judgment: Judgment normal.      LABS: Recent Results (from the past 2160 hour(s))  POCT HgB A1C     Status: Abnormal   Collection Time: 08/26/19 10:06 AM  Result Value Ref Range   Hemoglobin A1C 6.8 (A) 4.0 - 5.6 %   HbA1c POC (<> result, manual entry)     HbA1c, POC (prediabetic range)     HbA1c, POC (controlled diabetic range)    POCT HgB A1C     Status: Abnormal   Collection Time: 09/10/19  6:03 PM  Result Value Ref Range   Hemoglobin A1C 6.8 (A) 4.0 - 5.6 %   HbA1c POC (<> result, manual entry)     HbA1c, POC (prediabetic range)     HbA1c, POC (controlled diabetic range)       Assessment/Plan: 1. Encounter for general adult medical examination with abnormal findings Up to date on PHM. Indigent lab slip given to patient.   2. Type 2 diabetes mellitus with hyperglycemia, without long-term current use of insulin (HCC) A1C is improving, now 6.5. continue medication as directed.   - POCT HgB A1C  3. Cerebrovascular accident (CVA), unspecified mechanism ([place]) No residual deficit.   General Counseling: Maricarmen verbalizes understanding of the findings of todays visit and agrees with plan of treatment. I have discussed any further diagnostic evaluation that may be needed or ordered today. We also reviewed her medications today. she has been encouraged to call the office with any questions or concerns that should arise related to todays visit.   Orders Placed This Encounter  Procedures  . POCT HgB A1C    No orders of the defined types were placed in this encounter.   Time spent: 30 Minutes   This patient was seen by Orson Gear AGNP-C in Collaboration with Dr Lavera Guise as a part of collaborative care  agreement    Kendell Bane AGNP-C Internal Medicine

## 2020-02-10 ENCOUNTER — Telehealth: Payer: Self-pay

## 2020-02-10 NOTE — Telephone Encounter (Signed)
Confirmed and screened for OV on 9/14

## 2020-02-14 ENCOUNTER — Other Ambulatory Visit: Payer: Self-pay

## 2020-02-14 ENCOUNTER — Ambulatory Visit: Payer: BC Managed Care – PPO | Admitting: Nurse Practitioner

## 2020-02-14 VITALS — BP 108/74 | HR 86 | Temp 97.5°F | Resp 16 | Ht 61.0 in | Wt 128.1 lb

## 2020-02-14 DIAGNOSIS — E1165 Type 2 diabetes mellitus with hyperglycemia: Secondary | ICD-10-CM | POA: Diagnosis not present

## 2020-02-14 DIAGNOSIS — M064 Inflammatory polyarthropathy: Secondary | ICD-10-CM | POA: Diagnosis not present

## 2020-02-14 DIAGNOSIS — Z23 Encounter for immunization: Secondary | ICD-10-CM

## 2020-02-14 LAB — POCT GLYCOSYLATED HEMOGLOBIN (HGB A1C): Hemoglobin A1C: 6.4 % — AB (ref 4.0–5.6)

## 2020-02-14 NOTE — Progress Notes (Signed)
[facility] [address] [place], [place] [address]  Internal MEDICINE  Office Visit Note  Patient Name: Stacie Flores  [id number]  [id number]  Date of Service: 03/07/2020  Chief Complaint  Patient presents with  . Follow-up  . Hand Pain    burning sensation    The patient is here for routine follow up visit. Blood sugars are doing well. She has improved HgbA1c at 6.4 today. Tolerating her medications well. Her blood pressure is good without medication. She is due to have routine, fasting labs done. She states that she has some chronic joint pain in multiple areas. No injuries that she is aware of.       Current Medication: Outpatient Encounter Medications as of 02/14/2020  Medication Sig  . Ascorbic Acid (VITAMIN C) 1000 MG tablet Take 1,000 mg by mouth daily.  Marland Kitchen aspirin 81 MG chewable tablet Chew 1 tablet (81 mg total) by mouth daily.  . Biotin 1000 MCG tablet Take 1,000 mcg by mouth daily.  . cholecalciferol (VITAMIN D3) 25 MCG (1000 UT) tablet Take 1,000 Units by mouth daily.  . Flaxseed, Linseed, (GNP FLAX SEED OIL) 1000 MG CAPS Take by mouth.  . metFORMIN (GLUCOPHAGE) 1000 MG tablet TAKE ONE TAB TWICE DAILY WITH A MEAL.   No facility-administered encounter medications on file as of 02/14/2020.    Surgical History: Past Surgical History:  Procedure Laterality Date  . CESAREAN SECTION    . LEEP      Medical History: Past Medical History:  Diagnosis Date  . Asthma   . Diabetes mellitus without complication (HCC)   . Stroke [facility])     Family History: No family history on file.  Social History   Socioeconomic History  . Marital status: Single    Spouse name: Not on file  . Number of children: Not on file  . Years of education: Not on file  . Highest education level: Not on file  Occupational History  . Not on file  Tobacco Use  . Smoking status: Never Smoker  . Smokeless tobacco: Never Used  Substance and Sexual Activity  . Alcohol use: No   . Drug use: No  . Sexual activity: Yes  Other Topics Concern  . Not on file  Social History Narrative   Live with family   Social Determinants of Health   Financial Resource Strain:   . Difficulty of Paying Living Expenses: Not on file  Food Insecurity:   . Worried About Programme researcher, broadcasting/film/video in the Last Year: Not on file  . Ran Out of Food in the Last Year: Not on file  Transportation Needs:   . Lack of Transportation (Medical): Not on file  . Lack of Transportation (Non-Medical): Not on file  Physical Activity:   . Days of Exercise per Week: Not on file  . Minutes of Exercise per Session: Not on file  Stress:   . Feeling of Stress : Not on file  Social Connections:   . Frequency of Communication with Friends and Family: Not on file  . Frequency of Social Gatherings with Friends and Family: Not on file  . Attends Religious Services: Not on file  . Active Member of Clubs or Organizations: Not on file  . Attends Banker Meetings: Not on file  . Marital Status: Not on file  Intimate Partner Violence:   . Fear of Current or Ex-Partner: Not on file  . Emotionally Abused: Not on file  . Physically Abused:  Not on file  . Sexually Abused: Not on file      Review of Systems  Constitutional: Negative for activity change, chills, fatigue and unexpected weight change.  HENT: Negative for congestion, postnasal drip, rhinorrhea, sneezing and sore throat.   Respiratory: Negative for cough, chest tightness, shortness of breath and wheezing.   Cardiovascular: Negative for chest pain and palpitations.  Gastrointestinal: Negative for abdominal pain, constipation, diarrhea, nausea and vomiting.  Endocrine: Negative for cold intolerance, heat intolerance, polydipsia and polyuria.       Blood sugars doing well   Musculoskeletal: Positive for arthralgias and myalgias. Negative for back pain, joint swelling and neck pain.       Patient states that she has joint pain in multiple  areas.  Skin: Negative for rash.  Allergic/Immunologic: Negative for environmental allergies.  Neurological: Negative for dizziness, tremors, numbness and headaches.  Hematological: Negative for adenopathy. Does not bruise/bleed easily.  Psychiatric/Behavioral: Negative for behavioral problems (Depression), sleep disturbance and suicidal ideas. The patient is not nervous/anxious.     Today's Vitals   02/14/20 1555  BP: 108/74  Pulse: 86  Resp: 16  Temp: (!) 97.5 F (36.4 C)  SpO2: 96%  Weight: 128 lb 1.6 oz (58.1 kg)  Height: 5\' 1"  (1.549 m)   Body mass index is 24.2 kg/m.  Physical Exam Vitals and nursing note reviewed.  Constitutional:      General: She is not in acute distress.    Appearance: Normal appearance. She is well-developed. She is not diaphoretic.  HENT:     Head: Normocephalic and atraumatic.     Nose: Nose normal.     Mouth/Throat:     Pharynx: No oropharyngeal exudate.  Eyes:     Pupils: Pupils are equal, round, and reactive to light.  Neck:     Thyroid: No thyromegaly.     Vascular: No carotid bruit or JVD.     Trachea: No tracheal deviation.  Cardiovascular:     Rate and Rhythm: Normal rate and regular rhythm.     Heart sounds: Normal heart sounds. No murmur heard.  No friction rub. No gallop.   Pulmonary:     Effort: Pulmonary effort is normal. No respiratory distress.     Breath sounds: Normal breath sounds. No wheezing or rales.  Chest:     Chest wall: No tenderness.  Abdominal:     Palpations: Abdomen is soft.  Musculoskeletal:        General: Normal range of motion.     Cervical back: Normal range of motion and neck supple.     Comments: The patient has generalized joint pain without point tenderness present. No joint inflammation present. No abnormalities or deformities noted.   Lymphadenopathy:     Cervical: No cervical adenopathy.  Skin:    General: Skin is warm and dry.  Neurological:     Mental Status: She is alert and oriented to  person, place, and time.     Cranial Nerves: No cranial nerve deficit.  Psychiatric:        Mood and Affect: Mood normal.        Behavior: Behavior normal.        Thought Content: Thought content normal.        Judgment: Judgment normal.    Assessment/Plan: 1. Type 2 diabetes mellitus with hyperglycemia, without long-term current use of insulin (HCC) - POCT HgB A1C 64 today. Continue metformin as prescribed. Continue to monitor blood sugars closely.  2. Inflammatory polyarthropathy (  HCC) Check labs with connective tissue panel.   3. Flu vaccine need Flu shot administered in the office today.  - Flu Vaccine MDCK QUAD PF  General Counseling: Emmelia verbalizes understanding of the findings of todays visit and agrees with plan of treatment. I have discussed any further diagnostic evaluation that may be needed or ordered today. We also reviewed her medications today. she has been encouraged to call the office with any questions or concerns that should arise related to todays visit.  This patient was seen by Vincent Gros FNP Collaboration with Dr Lyndon Code as a part of collaborative care agreement  Orders Placed This Encounter  Procedures  . Flu Vaccine MDCK QUAD PF  . POCT HgB A1C     Total time spent: 30 Minutes   Time spent includes review of chart, medications, test results, and follow up plan with the patient.      Dr Lyndon Code Internal medicine

## 2020-02-20 ENCOUNTER — Other Ambulatory Visit: Payer: Self-pay

## 2020-02-20 DIAGNOSIS — Z0000 Encounter for general adult medical examination without abnormal findings: Secondary | ICD-10-CM

## 2020-02-23 ENCOUNTER — Other Ambulatory Visit: Payer: Self-pay

## 2020-02-23 ENCOUNTER — Other Ambulatory Visit: Payer: Medicaid Other

## 2020-02-24 ENCOUNTER — Other Ambulatory Visit: Payer: Medicaid Other

## 2020-02-24 ENCOUNTER — Other Ambulatory Visit: Payer: Self-pay

## 2020-02-24 DIAGNOSIS — Z0000 Encounter for general adult medical examination without abnormal findings: Secondary | ICD-10-CM

## 2020-02-25 LAB — COMPREHENSIVE METABOLIC PANEL
ALT: 11 IU/L (ref 0–32)
AST: 16 IU/L (ref 0–40)
Albumin/Globulin Ratio: 1.3 (ref 1.2–2.2)
Albumin: 4.1 g/dL (ref 3.8–4.8)
Alkaline Phosphatase: 78 IU/L (ref 44–121)
BUN/Creatinine Ratio: 11 (ref 9–23)
BUN: 8 mg/dL (ref 6–24)
Bilirubin Total: 0.5 mg/dL (ref 0.0–1.2)
CO2: 25 mmol/L (ref 20–29)
Calcium: 9.2 mg/dL (ref 8.7–10.2)
Chloride: 102 mmol/L (ref 96–106)
Creatinine, Ser: 0.75 mg/dL (ref 0.57–1.00)
GFR calc Af Amer: 108 mL/min/{1.73_m2} (ref >59)
GFR calc non Af Amer: 94 mL/min/{1.73_m2} (ref >59)
Globulin, Total: 3.1 g/dL (ref 1.5–4.5)
Glucose: 101 mg/dL — ABNORMAL HIGH (ref 65–99)
Potassium: 4.3 mmol/L (ref 3.5–5.2)
Sodium: 141 mmol/L (ref 134–144)
Total Protein: 7.2 g/dL (ref 6.0–8.5)

## 2020-02-25 LAB — CBC WITH DIFFERENTIAL/PLATELET
Basophils Absolute: 0 10*3/uL (ref 0.0–0.2)
Basos: 1 %
EOS (ABSOLUTE): 0.1 10*3/uL (ref 0.0–0.4)
Eos: 1 %
Hematocrit: 40.8 % (ref 34.0–46.6)
Hemoglobin: 12.8 g/dL (ref 11.1–15.9)
Immature Grans (Abs): 0 10*3/uL (ref 0.0–0.1)
Immature Granulocytes: 0 %
Lymphocytes Absolute: 3.1 10*3/uL (ref 0.7–3.1)
Lymphs: 37 %
MCH: 26.7 pg (ref 26.6–33.0)
MCHC: 31.4 g/dL — ABNORMAL LOW (ref 31.5–35.7)
MCV: 85 fL (ref 79–97)
Monocytes Absolute: 0.7 10*3/uL (ref 0.1–0.9)
Monocytes: 8 %
Neutrophils Absolute: 4.4 10*3/uL (ref 1.4–7.0)
Neutrophils: 53 %
Platelets: 197 10*3/uL (ref 150–450)
RBC: 4.79 x10E6/uL (ref 3.77–5.28)
RDW: 12.1 % (ref 11.7–15.4)
WBC: 8.3 10*3/uL (ref 3.4–10.8)

## 2020-02-25 LAB — LIPID PANEL
Chol/HDL Ratio: 1.9 ratio (ref 0.0–4.4)
Cholesterol, Total: 150 mg/dL (ref 100–199)
HDL: 77 mg/dL (ref >39)
LDL Chol Calc (NIH): 58 mg/dL (ref 0–99)
Triglycerides: 81 mg/dL (ref 0–149)
VLDL Cholesterol Cal: 15 mg/dL (ref 5–40)

## 2020-02-25 LAB — C-REACTIVE PROTEIN: CRP: <1 mg/L (ref 0–10)

## 2020-02-25 LAB — VITAMIN D 25 HYDROXY (VIT D DEFICIENCY, FRACTURES): Vit D, 25-Hydroxy: 51 ng/mL (ref 30.0–100.0)

## 2020-02-25 LAB — LACTATE DEHYDROGENASE: LDH: 127 IU/L (ref 119–226)

## 2020-02-25 LAB — ANA: Anti Nuclear Antibody (ANA): NEGATIVE

## 2020-02-25 LAB — TSH+FREE T4
Free T4: 1.25 ng/dL (ref 0.82–1.77)
TSH: 1.06 u[IU]/mL (ref 0.450–4.500)

## 2020-02-27 LAB — ANA W/REFLEX: ANA Titer 1: NEGATIVE

## 2020-02-27 NOTE — Progress Notes (Signed)
Review labs at next visit. Labs normal.

## 2020-03-07 ENCOUNTER — Encounter: Payer: Self-pay | Admitting: Nurse Practitioner

## 2020-03-07 DIAGNOSIS — M064 Inflammatory polyarthropathy: Secondary | ICD-10-CM | POA: Insufficient documentation

## 2020-03-07 DIAGNOSIS — Z23 Encounter for immunization: Secondary | ICD-10-CM | POA: Insufficient documentation

## 2020-03-14 ENCOUNTER — Other Ambulatory Visit: Payer: Self-pay

## 2020-03-14 ENCOUNTER — Other Ambulatory Visit: Payer: Self-pay | Admitting: Nurse Practitioner

## 2020-03-14 ENCOUNTER — Ambulatory Visit
Admission: RE | Admit: 2020-03-14 | Discharge: 2020-03-14 | Disposition: A | Discharge status: Home / Self Care | Payer: BC Managed Care – PPO | Source: Ambulatory Visit | Attending: Nurse Practitioner | Admitting: Nurse Practitioner

## 2020-03-14 DIAGNOSIS — Z1231 Encounter for screening mammogram for malignant neoplasm of breast: Secondary | ICD-10-CM | POA: Insufficient documentation

## 2020-03-16 NOTE — Progress Notes (Signed)
Negative mammogram

## 2020-04-10 ENCOUNTER — Other Ambulatory Visit: Payer: Self-pay

## 2020-04-10 DIAGNOSIS — E1165 Type 2 diabetes mellitus with hyperglycemia: Secondary | ICD-10-CM

## 2020-04-10 MED ORDER — METFORMIN HCL 1000 MG PO TABS: ORAL_TABLET | ORAL | 3 refills | Status: DC

## 2020-05-14 ENCOUNTER — Ambulatory Visit: Payer: BC Managed Care – PPO | Admitting: Nurse Practitioner

## 2020-05-22 ENCOUNTER — Ambulatory Visit (INDEPENDENT_AMBULATORY_CARE_PROVIDER_SITE_OTHER): Payer: BC Managed Care – PPO | Admitting: Nurse Practitioner

## 2020-05-22 ENCOUNTER — Other Ambulatory Visit: Payer: Self-pay

## 2020-05-22 ENCOUNTER — Encounter: Payer: Self-pay | Admitting: Nurse Practitioner

## 2020-05-22 VITALS — BP 119/68 | HR 81 | Temp 98.3°F | Resp 16 | Ht 62.0 in | Wt 129.2 lb

## 2020-05-22 DIAGNOSIS — E1165 Type 2 diabetes mellitus with hyperglycemia: Secondary | ICD-10-CM | POA: Diagnosis not present

## 2020-05-22 DIAGNOSIS — M064 Inflammatory polyarthropathy: Secondary | ICD-10-CM

## 2020-05-22 LAB — POCT GLYCOSYLATED HEMOGLOBIN (HGB A1C): Hemoglobin A1C: 6.1 % — AB (ref 4.0–5.6)

## 2020-05-22 NOTE — Progress Notes (Signed)
[facility] [address] [place], [place] [address]  Internal MEDICINE  Office Visit Note  Patient Name: Stacie Flores  [id number]  [id number]  Date of Service: 06/20/2020  Chief Complaint  Patient presents with  . Follow-up  . Diabetes  . Controlled Substance form    Reviewed with PT    The patient is here for follow up. -well managed type 2 diabetes - HgbA1c 6.1 today -review labs. Look good -negative connective tissue panel. No longer having jont pain -negative mammogram.03/14/2020       Current Medication: Outpatient Encounter Medications as of 05/22/2020  Medication Sig  . Ascorbic Acid (VITAMIN C) 1000 MG tablet Take 1,000 mg by mouth daily.  Marland Kitchen aspirin 81 MG chewable tablet Chew 1 tablet (81 mg total) by mouth daily.  . Biotin 1000 MCG tablet Take 1,000 mcg by mouth daily.  . cholecalciferol (VITAMIN D3) 25 MCG (1000 UT) tablet Take 1,000 Units by mouth daily.  . Flaxseed, Linseed, (GNP FLAX SEED OIL) 1000 MG CAPS Take by mouth.  . metFORMIN (GLUCOPHAGE) 1000 MG tablet TAKE ONE TAB TWICE DAILY WITH A MEAL.   No facility-administered encounter medications on file as of 05/22/2020.    Surgical History: Past Surgical History:  Procedure Laterality Date  . CESAREAN SECTION    . LEEP      Medical History: Past Medical History:  Diagnosis Date  . Asthma   . Diabetes mellitus without complication (HCC)   . Stroke [facility])     Family History: Family History  Problem Relation Age of Onset  . Breast cancer Sister        early 76's    Social History   Socioeconomic History  . Marital status: Single    Spouse name: Not on file  . Number of children: Not on file  . Years of education: Not on file  . Highest education level: Not on file  Occupational History  . Not on file  Tobacco Use  . Smoking status: Never Smoker  . Smokeless tobacco: Never Used  Substance and Sexual Activity  . Alcohol use: No  . Drug use: No  . Sexual activity: Yes   Other Topics Concern  . Not on file  Social History Narrative   Live with family   Social Determinants of Health   Financial Resource Strain: Not on file  Food Insecurity: Not on file  Transportation Needs: Not on file  Physical Activity: Not on file  Stress: Not on file  Social Connections: Not on file  Intimate Partner Violence: Not on file      Review of Systems  Constitutional: Negative for activity change, chills, fatigue and unexpected weight change.  HENT: Negative for congestion, postnasal drip, rhinorrhea, sneezing and sore throat.   Respiratory: Negative for cough, chest tightness, shortness of breath and wheezing.   Cardiovascular: Negative for chest pain and palpitations.  Gastrointestinal: Negative for abdominal pain, constipation, diarrhea, nausea and vomiting.  Endocrine: Negative for cold intolerance, heat intolerance, polydipsia and polyuria.       Blood sugars doing well   Musculoskeletal: Positive for arthralgias and myalgias. Negative for back pain, joint swelling and neck pain.       Patient states that she has joint pain in multiple areas.  Skin: Negative for rash.  Allergic/Immunologic: Negative for environmental allergies.  Neurological: Negative for dizziness, tremors, numbness and headaches.  Hematological: Negative for adenopathy. Does not bruise/bleed easily.  Psychiatric/Behavioral: Negative for behavioral problems (Depression), sleep disturbance and  suicidal ideas. The patient is not nervous/anxious.    Today's Vitals   05/22/20 1107  BP: 119/68  Pulse: 81  Resp: 16  Temp: 98.3 F (36.8 C)  SpO2: 93%  Weight: 129 lb 3.2 oz (58.6 kg)  Height: 5\' 2"  (1.575 m)   Body mass index is 23.63 kg/m.   Physical Exam Vitals and nursing note reviewed.  Constitutional:      General: She is not in acute distress.    Appearance: Normal appearance. She is well-developed. She is not diaphoretic.  HENT:     Head: Normocephalic and atraumatic.      Nose: Nose normal.     Mouth/Throat:     Pharynx: No oropharyngeal exudate.  Eyes:     Pupils: Pupils are equal, round, and reactive to light.  Neck:     Thyroid: No thyromegaly.     Vascular: No carotid bruit or JVD.     Trachea: No tracheal deviation.  Cardiovascular:     Rate and Rhythm: Normal rate and regular rhythm.     Heart sounds: Normal heart sounds. No murmur heard. No friction rub. No gallop.   Pulmonary:     Effort: Pulmonary effort is normal. No respiratory distress.     Breath sounds: Normal breath sounds. No wheezing or rales.  Chest:     Chest wall: No tenderness.  Abdominal:     Palpations: Abdomen is soft.  Musculoskeletal:        General: Normal range of motion.     Cervical back: Normal range of motion and neck supple.     Comments: The patient has generalized joint pain without point tenderness present. No joint inflammation present. No abnormalities or deformities noted.   Lymphadenopathy:     Cervical: No cervical adenopathy.  Skin:    General: Skin is warm and dry.  Neurological:     Mental Status: She is alert and oriented to person, place, and time.     Cranial Nerves: No cranial nerve deficit.  Psychiatric:        Mood and Affect: Mood normal.        Behavior: Behavior normal.        Thought Content: Thought content normal.        Judgment: Judgment normal.    Assessment/Plan: 1. Type 2 diabetes mellitus with hyperglycemia, without long-term current use of insulin (HCC) - POCT HgB A1C 6.1 today. Continue diabetic medication as prescribed. Monitor blood sugars closely.   2. Inflammatory polyarthropathy (HCC) Reviewed labs done 02/2020 which showed no evidence of auto-immune arthritis.   General Counseling: [name] verbalizes understanding of the findings of todays visit and agrees with plan of treatment. I have discussed any further diagnostic evaluation that may be needed or ordered today. We also reviewed her medications today. she has been  encouraged to call the office with any questions or concerns that should arise related to todays visit.  This patient was seen by 03/2020 FNP Collaboration with Dr Vincent Gros as a part of collaborative care agreement  Orders Placed This Encounter  Procedures  . POCT HgB A1C     Total time spent: 30 Minutes   Time spent includes review of chart, medications, test results, and follow up plan with the patient.      Dr Lyndon Code Internal medicine

## 2020-06-19 ENCOUNTER — Telehealth: Payer: Self-pay

## 2020-06-19 ENCOUNTER — Other Ambulatory Visit: Payer: Self-pay

## 2020-06-19 ENCOUNTER — Ambulatory Visit (INDEPENDENT_AMBULATORY_CARE_PROVIDER_SITE_OTHER): Payer: BC Managed Care – PPO | Admitting: Internal Medicine

## 2020-06-19 ENCOUNTER — Encounter: Payer: Self-pay | Admitting: Internal Medicine

## 2020-06-19 VITALS — Ht 61.0 in | Wt 130.0 lb

## 2020-06-19 DIAGNOSIS — J029 Acute pharyngitis, unspecified: Secondary | ICD-10-CM

## 2020-06-19 DIAGNOSIS — U071 COVID-19: Secondary | ICD-10-CM

## 2020-06-19 DIAGNOSIS — Z03818 Encounter for observation for suspected exposure to other biological agents ruled out: Secondary | ICD-10-CM | POA: Diagnosis not present

## 2020-06-19 DIAGNOSIS — Z20822 Contact with and (suspected) exposure to covid-19: Secondary | ICD-10-CM

## 2020-06-19 MED ORDER — AZITHROMYCIN 250 MG PO TABS: ORAL_TABLET | ORAL | 0 refills | Status: DC

## 2020-06-19 NOTE — Telephone Encounter (Signed)
Pt called that her covid test is negative as per dr Welton Flakes advised we send Zpak and advised  Her gargle with salt warm and drink plenty water and rest

## 2020-06-19 NOTE — Telephone Encounter (Signed)
Tried calling patient to schedule 1 week follow up from appointment on 06/19/20, no answer, if pt returns call I need to schedule follow up. Stacie Flores

## 2020-06-19 NOTE — Progress Notes (Signed)
[facility] [address] [place], [place] [address]  Internal MEDICINE  Telephone Visit  Patient Name: Stacie Flores  [id number]  [id number]  Date of Service: 06/19/2020  I connected with the patient at 1005 by telephone and verified the patients identity using two identifiers.   I discussed the limitations, risks, security and privacy concerns of performing an evaluation and management service by telephone and the availability of in person appointments. I also discussed with the patient that there may be a patient responsible charge related to the service.  The patient expressed understanding and agrees to proceed.    Chief Complaint  Patient presents with  . Telephone Assessment    [phone]  . Sinusitis  . Sore Throat     Body aches      HPI Pt is connected for acute and sick visit C/O sore throat and body aches, does not know if she has fever.  Exposure to COVID ???  No vaccination for COVID  No chest congestion  Pt is unable to get out due to snow however she is planning to do so  Current Medication: Outpatient Encounter Medications as of 06/19/2020  Medication Sig  . Ascorbic Acid (VITAMIN C) 1000 MG tablet Take 1,000 mg by mouth daily.  Marland Kitchen aspirin 81 MG chewable tablet Chew 1 tablet (81 mg total) by mouth daily.  Marland Kitchen azithromycin (ZITHROMAX) 250 MG tablet Take one tab a day for 10 days for uri  . Biotin 1000 MCG tablet Take 1,000 mcg by mouth daily.  . cholecalciferol (VITAMIN D3) 25 MCG (1000 UT) tablet Take 1,000 Units by mouth daily.  . Flaxseed, Linseed, (GNP FLAX SEED OIL) 1000 MG CAPS Take by mouth.  . metFORMIN (GLUCOPHAGE) 1000 MG tablet TAKE ONE TAB TWICE DAILY WITH A MEAL.   No facility-administered encounter medications on file as of 06/19/2020.    Surgical History: Past Surgical History:  Procedure Laterality Date  . CESAREAN SECTION    . LEEP      Medical History: Past Medical History:  Diagnosis Date  . Asthma   . Diabetes mellitus  without complication (HCC)   . Stroke [facility])     Family History: Family History  Problem Relation Age of Onset  . Breast cancer Sister        early 38's    Social History   Socioeconomic History  . Marital status: Single    Spouse name: Not on file  . Number of children: Not on file  . Years of education: Not on file  . Highest education level: Not on file  Occupational History  . Not on file  Tobacco Use  . Smoking status: Never Smoker  . Smokeless tobacco: Never Used  Substance and Sexual Activity  . Alcohol use: No  . Drug use: No  . Sexual activity: Yes  Other Topics Concern  . Not on file  Social History Narrative   Live with family   Social Determinants of Health   Financial Resource Strain: Not on file  Food Insecurity: Not on file  Transportation Needs: Not on file  Physical Activity: Not on file  Stress: Not on file  Social Connections: Not on file  Intimate Partner Violence: Not on file      Review of Systems  Constitutional: Positive for chills. Negative for fatigue and fever.  HENT: Positive for sore throat. Negative for congestion, mouth sores and postnasal drip.   Respiratory: Negative for cough.   Cardiovascular: Negative for chest pain.  Genitourinary: Negative for flank pain.  Psychiatric/Behavioral: Negative.     Vital Signs: Ht 5\' 1"  (1.549 m)   Wt 130 lb (59 kg)   BMI 24.56 kg/m    Observation/Objective: Pt seems to be congested but NAD   Assessment/Plan: 1. Sore throat She is willing to go get tested for covid ( pt reported negative test later on) rest and monitor her symptoms closely   2. Exposure to COVID-19 virus She works at and has strong possibility of COVID exposure   General Counseling: Nyhla verbalizes understanding of the findings of today's phone visit and agrees with plan of treatment. I have discussed any further diagnostic evaluation that may be needed or ordered today. We also reviewed her medications  today. she has been encouraged to call the office with any questions or concerns that should arise related to todays visit.   Time spent: 10 Minutes  Dr [employer] Internal medicine

## 2020-08-21 ENCOUNTER — Other Ambulatory Visit: Payer: Self-pay

## 2020-08-21 ENCOUNTER — Encounter: Payer: Self-pay | Admitting: Hospice and Palliative Medicine

## 2020-08-21 ENCOUNTER — Ambulatory Visit (INDEPENDENT_AMBULATORY_CARE_PROVIDER_SITE_OTHER): Payer: BC Managed Care – PPO | Admitting: Hospice and Palliative Medicine

## 2020-08-21 VITALS — BP 106/80 | HR 77 | Temp 97.4°F | Resp 16 | Ht 61.0 in | Wt 131.6 lb

## 2020-08-21 DIAGNOSIS — R202 Paresthesia of skin: Secondary | ICD-10-CM

## 2020-08-21 DIAGNOSIS — E1165 Type 2 diabetes mellitus with hyperglycemia: Secondary | ICD-10-CM | POA: Diagnosis not present

## 2020-08-21 DIAGNOSIS — Z8673 Personal history of transient ischemic attack (TIA), and cerebral infarction without residual deficits: Secondary | ICD-10-CM

## 2020-08-21 LAB — POCT GLYCOSYLATED HEMOGLOBIN (HGB A1C): Hemoglobin A1C: 6.3 % — AB (ref 4.0–5.6)

## 2020-08-21 MED ORDER — METFORMIN HCL 1000 MG PO TABS
1000.0000 mg | ORAL_TABLET | Freq: Every day | ORAL | 1 refills | Status: DC
Start: 2020-08-21 — End: 2020-12-21

## 2020-08-21 MED ORDER — METFORMIN HCL 1000 MG PO TABS
ORAL_TABLET | ORAL | 3 refills | Status: DC
Start: 2020-08-21 — End: 2020-08-21

## 2020-08-21 NOTE — Progress Notes (Signed)
[facility] [address] [place], [place] [address]  Internal MEDICINE  Office Visit Note  Patient Name: Stacie Flores  [id number]  [id number]  Date of Service: 08/22/2020  Chief Complaint  Patient presents with  . Follow-up    Burning sensation and numbness in fingers, started around when she first got diabetes, doesn't last all day   . Diabetes  . Asthma    HPI Patient is here for routine follow-up DM-has been working hard on limiting her carb and sugar intake, would like to be able to stop taking medications C/o burning and tingling in her upper extremities, intermittent symptoms, will occur maybe a few times per week, will last about an hour and then resolve without intervention She feels symptoms first started when she was diagnosed with diabetes a few years ago (2020) Significant history of CVA in 2019--ASA for prophylaxis, no residual deficits  Current Medication: Outpatient Encounter Medications as of 08/21/2020  Medication Sig  . metFORMIN (GLUCOPHAGE) 1000 MG tablet Take 1 tablet (1,000 mg total) by mouth daily with breakfast.  . Ascorbic Acid (VITAMIN C) 1000 MG tablet Take 1,000 mg by mouth daily.  Marland Kitchen aspirin 81 MG chewable tablet Chew 1 tablet (81 mg total) by mouth daily.  . Biotin 1000 MCG tablet Take 1,000 mcg by mouth daily.  . cholecalciferol (VITAMIN D3) 25 MCG (1000 UT) tablet Take 1,000 Units by mouth daily.  . Flaxseed, Linseed, (GNP FLAX SEED OIL) 1000 MG CAPS Take by mouth.  . [DISCONTINUED] azithromycin (ZITHROMAX) 250 MG tablet Take one tab a day for 10 days for uri (Patient not taking: Reported on 08/21/2020)  . [DISCONTINUED] metFORMIN (GLUCOPHAGE) 1000 MG tablet TAKE ONE TAB TWICE DAILY WITH A MEAL.  . [DISCONTINUED] metFORMIN (GLUCOPHAGE) 1000 MG tablet TAKE ONE TAB TWICE DAILY WITH A MEAL.   No facility-administered encounter medications on file as of 08/21/2020.    Surgical History: Past Surgical History:  Procedure Laterality Date  .  CESAREAN SECTION    . LEEP      Medical History: Past Medical History:  Diagnosis Date  . Asthma   . Diabetes mellitus without complication (HCC)   . Stroke [facility])     Family History: Family History  Problem Relation Age of Onset  . Breast cancer Sister        early 51's    Social History   Socioeconomic History  . Marital status: Single    Spouse name: Not on file  . Number of children: Not on file  . Years of education: Not on file  . Highest education level: Not on file  Occupational History  . Not on file  Tobacco Use  . Smoking status: Never Smoker  . Smokeless tobacco: Never Used  Substance and Sexual Activity  . Alcohol use: No  . Drug use: No  . Sexual activity: Yes  Other Topics Concern  . Not on file  Social History Narrative   Live with family   Social Determinants of Health   Financial Resource Strain: Not on file  Food Insecurity: Not on file  Transportation Needs: Not on file  Physical Activity: Not on file  Stress: Not on file  Social Connections: Not on file  Intimate Partner Violence: Not on file      Review of Systems  Constitutional: Negative for chills, diaphoresis and fatigue.  HENT: Negative for ear pain, postnasal drip and sinus pressure.   Eyes: Negative for photophobia, discharge, redness, itching and visual disturbance.  Respiratory: Negative for cough, shortness of breath and wheezing.   Cardiovascular: Negative for chest pain, palpitations and leg swelling.  Gastrointestinal: Negative for abdominal pain, constipation, diarrhea, nausea and vomiting.  Genitourinary: Negative for dysuria and flank pain.  Musculoskeletal: Negative for arthralgias, back pain, gait problem and neck pain.       Upper extremity paraesthesia   Skin: Negative for color change.  Allergic/Immunologic: Negative for environmental allergies and food allergies.  Neurological: Negative for dizziness and headaches.  Hematological: Does not bruise/bleed  easily.  Psychiatric/Behavioral: Negative for agitation, behavioral problems (depression) and hallucinations.    Vital Signs: BP 106/80   Pulse 77   Temp (!) 97.4 F (36.3 C)   Resp 16   Ht 5\' 1"  (1.549 m)   Wt 131 lb 9.6 oz (59.7 kg)   SpO2 98%   BMI 24.87 kg/m    Physical Exam Vitals reviewed.  Constitutional:      Appearance: Normal appearance. She is normal weight.  Cardiovascular:     Rate and Rhythm: Normal rate and regular rhythm.     Pulses: Normal pulses.  Pulmonary:     Effort: Pulmonary effort is normal.     Breath sounds: Normal breath sounds.  Abdominal:     General: Abdomen is flat.     Palpations: Abdomen is soft.  Musculoskeletal:        General: Normal range of motion.     Cervical back: Normal range of motion.  Skin:    General: Skin is warm.  Neurological:     General: No focal deficit present.     Mental Status: She is alert and oriented to person, place, and time. Mental status is at baseline.  Psychiatric:        Mood and Affect: Mood normal.        Behavior: Behavior normal.        Thought Content: Thought content normal.        Judgment: Judgment normal.    Assessment/Plan: 1. Type 2 diabetes mellitus with hyperglycemia, without long-term current use of insulin (HCC) A1C 6.3--will lower dose of Metformin to 1000 mg daily Consider changing therapy at next visit - POCT HgB A1C - metFORMIN (GLUCOPHAGE) 1000 MG tablet; Take 1 tablet (1,000 mg total) by mouth daily with breakfast.  Dispense: 90 tablet; Refill: 1  2. Paresthesia of upper extremity Unlikely due to DM or history of CVA, more likely MSK etiology, due to mild nature of symptoms will monitor for now  3. History of CVA (cerebrovascular accident) without residual deficits Review of hospital encounter less likely CVA--MRI imaging unclear, no residual side effects, continue ASA for prophylaxis  General Counseling: [name] verbalizes understanding of the findings of todays visit and  agrees with plan of treatment. I have discussed any further diagnostic evaluation that may be needed or ordered today. We also reviewed her medications today. she has been encouraged to call the office with any questions or concerns that should arise related to todays visit.    Orders Placed This Encounter  Procedures  . POCT HgB A1C    Meds ordered this encounter  Medications  . DISCONTD: metFORMIN (GLUCOPHAGE) 1000 MG tablet    Sig: TAKE ONE TAB TWICE DAILY WITH A MEAL.    Dispense:  60 tablet    Refill:  3  . metFORMIN (GLUCOPHAGE) 1000 MG tablet    Sig: Take 1 tablet (1,000 mg total) by mouth daily with breakfast.    Dispense:  90 tablet  Refill:  1    Time spent: 30 Minutes Time spent includes review of chart, medications, test results and follow-up plan with the patient.  This patient was seen by Theodoro Grist AGNP-C in Collaboration with Dr Lavera Guise as a part of collaborative care agreement     Tanna Furry. Thekla Colborn AGNP-C Internal medicine

## 2020-08-22 ENCOUNTER — Encounter: Payer: Self-pay | Admitting: Hospice and Palliative Medicine

## 2020-11-23 ENCOUNTER — Other Ambulatory Visit: Payer: Self-pay

## 2020-11-23 ENCOUNTER — Ambulatory Visit (INDEPENDENT_AMBULATORY_CARE_PROVIDER_SITE_OTHER): Payer: BC Managed Care – PPO | Admitting: Physician Assistant

## 2020-11-23 DIAGNOSIS — R202 Paresthesia of skin: Secondary | ICD-10-CM | POA: Diagnosis not present

## 2020-11-23 DIAGNOSIS — E1165 Type 2 diabetes mellitus with hyperglycemia: Secondary | ICD-10-CM | POA: Diagnosis not present

## 2020-11-23 DIAGNOSIS — R1319 Other dysphagia: Secondary | ICD-10-CM | POA: Diagnosis not present

## 2020-11-23 DIAGNOSIS — Z8673 Personal history of transient ischemic attack (TIA), and cerebral infarction without residual deficits: Secondary | ICD-10-CM

## 2020-11-23 DIAGNOSIS — G4710 Hypersomnia, unspecified: Secondary | ICD-10-CM

## 2020-11-23 DIAGNOSIS — Z0001 Encounter for general adult medical examination with abnormal findings: Secondary | ICD-10-CM | POA: Diagnosis not present

## 2020-11-23 DIAGNOSIS — R300 Dysuria: Secondary | ICD-10-CM

## 2020-11-23 LAB — POCT GLYCOSYLATED HEMOGLOBIN (HGB A1C): Hemoglobin A1C: 6.6 % — AB (ref 4.0–5.6)

## 2020-11-23 NOTE — Progress Notes (Signed)
[facility] [address] [place], [place] [address]  Internal MEDICINE  Office Visit Note  Patient Name: Stacie Flores  [id number]  [id number]  Date of Service: 11/23/2020  Chief Complaint  Patient presents with   Annual Exam    Hands burning, started over the last year, is more frequent 3 to 4 times a weeks, remembering, swallowing      HPI Pt is here for routine health maintenance examination -Having burning pain in both hands when she wakes up in the AM--improves throughout the day. Trying anew pillow to see if she is sleeping in a way that may be hurting her neck-known arthritis in neck but neck does not actually hurt. Pain is in her back. 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 Fasting 120s. Really working on diet/lifestyle changes. Goal to get off medication. -Also notes she has some difficulty swallowing, even just saliva which worries her--concerned this may be delayed symptom from her stroke in 2019 though states it was not a problem until the past year and she has no other lasting symptoms/deficits from stroke  EPWORTH SLEEPINESS SCALE:  Scale:  (0)= no chance of dozing; (1)= slight chance of dozing; (2)= moderate chance of dozing; (3)= high chance of dozing  Chance  Situtation    Sitting and reading: 0    Watching TV: 3    Sitting Inactive in public: 0    As a passenger in car: 2       Lying down to rest: 3    Sitting and talking: 0    Sitting quielty after lunch: 3    In a car, stopped in traffic: 0   TOTAL SCORE:   11 out of 24   Current Medication: Outpatient Encounter Medications as of 11/23/2020  Medication Sig   Ascorbic Acid (VITAMIN C) 1000 MG tablet Take 1,000 mg by mouth daily.   aspirin 81 MG chewable tablet Chew 1 tablet (81 mg total) by mouth daily.   Biotin 1000 MCG tablet Take 1,000 mcg by mouth daily.   cholecalciferol (VITAMIN D3) 25 MCG (1000 UT) tablet Take 1,000 Units by mouth daily.   Cyanocobalamin (CVS B12 GUMMIES PO) Take by mouth.   Flaxseed, Linseed, (GNP FLAX SEED OIL) 1000 MG CAPS Take by mouth.   metFORMIN (GLUCOPHAGE) 1000 MG tablet Take 1 tablet (1,000 mg total) by mouth daily with breakfast.   No facility-administered encounter medications on file as of 11/23/2020.    Surgical History: Past Surgical History:  Procedure Laterality Date   CESAREAN SECTION     LEEP      Medical History: Past Medical History:  Diagnosis Date   Asthma    Diabetes mellitus without complication (HCC)    Stroke (HCC)     Family History: Family History  Problem Relation Age of Onset   Breast cancer Sister  early 25's      Review of Systems  Constitutional:  Positive for fatigue. Negative for chills and unexpected weight change.  HENT:  Positive for trouble swallowing. Negative for congestion, postnasal drip, rhinorrhea, sneezing and sore throat.   Eyes:  Negative for redness.  Respiratory:  Negative for cough, chest tightness and shortness of breath.   Cardiovascular:  Negative for chest pain and palpitations.  Gastrointestinal:  Negative for abdominal pain, constipation, diarrhea, nausea and vomiting.  Genitourinary:  Negative for dysuria and frequency.  Musculoskeletal:  Positive for back pain. Negative for arthralgias, joint swelling and neck pain.  Skin:  Negative for rash.  Neurological: Negative.  Negative for  tremors, numbness and headaches.       Burning sensation in hands in AM but no numbness/tingling  Hematological:  Negative for adenopathy. Does not bruise/bleed easily.  Psychiatric/Behavioral:  Positive for sleep disturbance. Negative for behavioral problems (Depression) and suicidal ideas. The patient is not nervous/anxious.     Vital Signs: BP 108/72   Pulse 85   Temp 97.7 F (36.5 C)   Resp 16   Ht 5\' 1"  (1.549 m)   Wt 131 lb 12.8 oz (59.8 kg)   SpO2 99%   BMI 24.90 kg/m    Physical Exam Vitals and nursing note reviewed.  Constitutional:      General: She is not in acute distress.    Appearance: She is well-developed and normal weight. She is not diaphoretic.  HENT:     Head: Normocephalic and atraumatic.     Right Ear: External ear normal.     Left Ear: External ear normal.     Nose: Nose normal.     Mouth/Throat:     Pharynx: No oropharyngeal exudate.  Eyes:     General: No scleral icterus.       Right eye: No discharge.        Left eye: No discharge.     Conjunctiva/sclera: Conjunctivae normal.     Pupils: Pupils are equal, round, and reactive to light.  Neck:     Thyroid: No thyromegaly.     Vascular: No JVD.     Trachea: No tracheal deviation.  Cardiovascular:     Rate and Rhythm: Normal rate and regular rhythm.     Pulses:          Dorsalis pedis pulses are 3+ on the right side and 3+ on the left side.       Posterior tibial pulses are 3+ on the right side and 3+ on the left side.     Heart sounds: Normal heart sounds. No murmur heard.   No friction rub. No gallop.  Pulmonary:     Effort: Pulmonary effort is normal. No respiratory distress.     Breath sounds: Normal breath sounds. No stridor. No wheezing or rales.  Chest:     Chest wall: No tenderness.  Abdominal:     General: Bowel sounds are normal. There is no distension.     Palpations: Abdomen is soft. There is no mass.     Tenderness: There is no abdominal tenderness. There is no guarding or  rebound.  Musculoskeletal:        General: No tenderness or deformity. Normal range of motion.     Cervical back: Normal range of motion and neck supple.     Right foot: Normal range of motion.     Left foot: Normal range of motion.  Feet:     Right foot:  Protective Sensation: 2 sites tested.  2 sites sensed.     Skin integrity: Skin integrity normal.     Toenail Condition: Right toenails are normal.     Left foot:     Protective Sensation: 2 sites tested.  2 sites sensed.     Skin integrity: Skin integrity normal.     Toenail Condition: Left toenails are normal.  Lymphadenopathy:     Cervical: No cervical adenopathy.  Skin:    General: Skin is warm and dry.     Coloration: Skin is not pale.     Findings: No erythema or rash.  Neurological:     Mental Status: She is alert.     Cranial Nerves: No cranial nerve deficit.     Sensory: No sensory deficit.     Motor: No abnormal muscle tone.     Coordination: Coordination normal.     Deep Tendon Reflexes: Reflexes are normal and symmetric.  Psychiatric:        Behavior: Behavior normal.        Thought Content: Thought content normal.        Judgment: Judgment normal.     LABS: Recent Results (from the past 2160 hour(s))  POCT HgB A1C     Status: Abnormal   Collection Time: 11/23/20  1:13 PM  Result Value Ref Range   Hemoglobin A1C 6.6 (A) 4.0 - 5.6 %   HbA1c POC (<> result, manual entry)     HbA1c, POC (prediabetic range)     HbA1c, POC (controlled diabetic range)          Assessment/Plan: 1. Encounter for general adult medical examination with abnormal findings Will need updated mammogram in Oct, will update labs--lab slip given to have done at workplace lab  2. Type 2 diabetes mellitus with hyperglycemia, without long-term current use of insulin (HCC) - POCT HgB A1C is 6.6, which is an increase from last time with the decrease in Metformin, will continue on 1.5 tabs and continue to monitor closely. Pt working to  improve diet and exercise  3. Hypersomnia Based on daytime sleepiness, loud snoring, and brain fog will order PSG - PSG SLEEP STUDY  4. Other dysphagia Worsening swallow difficulty especially initiating swallow with saliva as well as pills. Will obtain swallow study for further eval--may need SLP - DG SWALLOW FUNC SPEECH PATH; Future  5. Paresthesia of upper extremity Will check labs including B12 and iron, will also check PSG to additionally look for O2 desats at night  6. History of CVA (cerebrovascular accident) without residual deficits Continue on ASA   7. Dysuria - UA/M w/rflx Culture, Routine   General Counseling: Odesser verbalizes understanding of the findings of todays visit and agrees with plan of treatment. I have discussed any further diagnostic evaluation that may be needed or ordered today. We also reviewed her medications today. she has been encouraged to call the office with any questions or concerns that should arise related to todays visit.    Counseling:    Orders Placed This Encounter  Procedures   DG SWALLOW FUNC SPEECH PATH   UA/M w/rflx Culture, Routine   POCT HgB A1C   PSG SLEEP STUDY    No orders of the defined types were placed in this encounter.   This patient was seen by Lynn Ito, PA-C in collaboration with Dr. Beverely Risen as a part of collaborative care agreement.  Total time spent:45 Minutes  Time spent includes review of chart, medications, test results, and  follow up plan with the patient.     Lavera Guise, MD  Internal Medicine

## 2020-11-24 LAB — UA/M W/RFLX CULTURE, ROUTINE
Bilirubin, UA: NEGATIVE
Glucose, UA: NEGATIVE
Leukocytes,UA: NEGATIVE
Nitrite, UA: NEGATIVE
Protein,UA: NEGATIVE
RBC, UA: NEGATIVE
Specific Gravity, UA: >=1.03 — AB (ref 1.005–1.030)
Urobilinogen, Ur: 0.2 mg/dL (ref 0.2–1.0)
pH, UA: 6 (ref 5.0–7.5)

## 2020-11-24 LAB — MICROSCOPIC EXAMINATION
Bacteria, UA: NONE SEEN
Casts: NONE SEEN /lpf
WBC, UA: NONE SEEN /hpf (ref 0–5)

## 2020-11-29 ENCOUNTER — Other Ambulatory Visit: Payer: Medicaid Other

## 2020-12-05 ENCOUNTER — Other Ambulatory Visit: Payer: Self-pay

## 2020-12-05 ENCOUNTER — Other Ambulatory Visit: Payer: BC Managed Care – PPO

## 2020-12-05 DIAGNOSIS — E785 Hyperlipidemia, unspecified: Secondary | ICD-10-CM

## 2020-12-05 DIAGNOSIS — R5383 Other fatigue: Secondary | ICD-10-CM

## 2020-12-05 DIAGNOSIS — E039 Hypothyroidism, unspecified: Secondary | ICD-10-CM

## 2020-12-05 DIAGNOSIS — R6889 Other general symptoms and signs: Secondary | ICD-10-CM

## 2020-12-05 DIAGNOSIS — E538 Deficiency of other specified B group vitamins: Secondary | ICD-10-CM

## 2020-12-05 DIAGNOSIS — E559 Vitamin D deficiency, unspecified: Secondary | ICD-10-CM

## 2020-12-05 NOTE — Progress Notes (Signed)
Lab work

## 2020-12-06 ENCOUNTER — Telehealth: Payer: Self-pay

## 2020-12-06 LAB — CBC WITH DIFFERENTIAL/PLATELET
Basophils Absolute: 0 10*3/uL (ref 0.0–0.2)
Basos: 1 %
EOS (ABSOLUTE): 0.1 10*3/uL (ref 0.0–0.4)
Eos: 1 %
Hematocrit: 39.3 % (ref 34.0–46.6)
Hemoglobin: 12 g/dL (ref 11.1–15.9)
Immature Grans (Abs): 0 10*3/uL (ref 0.0–0.1)
Immature Granulocytes: 0 %
Lymphocytes Absolute: 3.2 10*3/uL — ABNORMAL HIGH (ref 0.7–3.1)
Lymphs: 46 %
MCH: 26.2 pg — ABNORMAL LOW (ref 26.6–33.0)
MCHC: 30.5 g/dL — ABNORMAL LOW (ref 31.5–35.7)
MCV: 86 fL (ref 79–97)
Monocytes Absolute: 0.6 10*3/uL (ref 0.1–0.9)
Monocytes: 8 %
Neutrophils Absolute: 3 10*3/uL (ref 1.4–7.0)
Neutrophils: 44 %
Platelets: 265 10*3/uL (ref 150–450)
RBC: 4.58 x10E6/uL (ref 3.77–5.28)
RDW: 12.4 % (ref 11.7–15.4)
WBC: 6.9 10*3/uL (ref 3.4–10.8)

## 2020-12-06 LAB — LIPID PANEL
Chol/HDL Ratio: 2.2 ratio (ref 0.0–4.4)
Cholesterol, Total: 146 mg/dL (ref 100–199)
HDL: 66 mg/dL (ref >39)
LDL Chol Calc (NIH): 63 mg/dL (ref 0–99)
Triglycerides: 92 mg/dL (ref 0–149)
VLDL Cholesterol Cal: 17 mg/dL (ref 5–40)

## 2020-12-06 LAB — COMPREHENSIVE METABOLIC PANEL
ALT: 7 IU/L (ref 0–32)
AST: 14 IU/L (ref 0–40)
Albumin/Globulin Ratio: 1.4 (ref 1.2–2.2)
Albumin: 3.9 g/dL (ref 3.8–4.8)
Alkaline Phosphatase: 65 IU/L (ref 44–121)
BUN/Creatinine Ratio: 11 (ref 9–23)
BUN: 7 mg/dL (ref 6–24)
Bilirubin Total: <0.2 mg/dL (ref 0.0–1.2)
CO2: 20 mmol/L (ref 20–29)
Calcium: 8.6 mg/dL — ABNORMAL LOW (ref 8.7–10.2)
Chloride: 107 mmol/L — ABNORMAL HIGH (ref 96–106)
Creatinine, Ser: 0.62 mg/dL (ref 0.57–1.00)
Globulin, Total: 2.7 g/dL (ref 1.5–4.5)
Glucose: 108 mg/dL — ABNORMAL HIGH (ref 65–99)
Potassium: 3.7 mmol/L (ref 3.5–5.2)
Sodium: 140 mmol/L (ref 134–144)
Total Protein: 6.6 g/dL (ref 6.0–8.5)
eGFR: 109 mL/min/{1.73_m2} (ref >59)

## 2020-12-06 LAB — B12 AND FOLATE PANEL
Folate: 9.1 ng/mL (ref >3.0)
Vitamin B-12: >2000 pg/mL — ABNORMAL HIGH (ref 232–1245)

## 2020-12-06 LAB — TSH+FREE T4
Free T4: 1.03 ng/dL (ref 0.82–1.77)
TSH: 1.89 u[IU]/mL (ref 0.450–4.500)

## 2020-12-06 LAB — FERRITIN: Ferritin: 21 ng/mL (ref 15–150)

## 2020-12-06 LAB — VITAMIN D 25 HYDROXY (VIT D DEFICIENCY, FRACTURES): Vit D, 25-Hydroxy: 39.6 ng/mL (ref 30.0–100.0)

## 2020-12-06 LAB — IRON: Iron: 39 ug/dL (ref 27–159)

## 2020-12-06 NOTE — Telephone Encounter (Signed)
Patient has been advised that she's scheduled for speech/swallow function test on 12/21/20 @12 :30 at [facility]. No prep req'd

## 2020-12-12 ENCOUNTER — Encounter (INDEPENDENT_AMBULATORY_CARE_PROVIDER_SITE_OTHER): Payer: BC Managed Care – PPO | Admitting: Internal Medicine

## 2020-12-12 DIAGNOSIS — G4719 Other hypersomnia: Secondary | ICD-10-CM

## 2020-12-14 DIAGNOSIS — G4719 Other hypersomnia: Secondary | ICD-10-CM | POA: Insufficient documentation

## 2020-12-14 NOTE — Procedures (Signed)
[facility]  Polysomnogram Report Part I                                                                 Phone: [phone] Fax: [phone]  Patient Name: Stacie Flores, Stacie Flores Acquisition Number: [id number]  Date of Birth: 01/09/1971 Acquisition Date: 12/12/2020  Referring Physician: Carlean Jews, PA-C     History: The patient is a 50 year old female who was referred for evaluation of possible  sleep apnea. Medical History: Hypersomnia, dysphagia, CVA, dysuria  Medications: Vit C, aspirin, Biotin, Vit D3, cholecalciferol, Vit B12, flaxseed, metformin  Procedure: This routine overnight polysomnogram was performed on the [facility] 5 using the standard diagnostic protocol. This included 6 channels of EEG, 2 channels of EOG, chin EMG, bilateral anterior tibialis EMG, nasal/oral thermistor, PTAF (nasal pressure transducer), chest and abdominal wall movements, EKG, and pulse oximetry.  Description: The total recording time was 441.3 minutes. The total sleep time was 357.0 minutes. There were a total of 61.8 minutes of wakefulness after sleep onset for a reducedsleep efficiency of 80.9%. The latency to sleep onset was within normal limitsat 22.5 minutes. The R sleep onset latency was prolonged at 162.5 minutes. Sleep parameters, as a percentage of the total sleep time, demonstrated 27.0% of sleep was in N1 sleep, 45.2% N2, 6.7% N3 and 21.0% R sleep. There were a total of 155 arousals for an arousal index of 26.1 arousals per hour of sleep that was elevated.  Respiratory monitoring demonstrated snoring. Only 6 respiratory events were observed the entire study. The baseline oxygen saturation during wakefulness was 97%, during NREM sleep averaged 97%, and during REM sleep averaged  97%. The total duration of oxygen < 90% was 0.4 minutes.  Cardiac monitoring- There were no significant cardiac rhythm irregularities.   Periodic limb movement monitoring- demonstrated that there were 84 periodic  limb movements for a periodic limb movement index of 14.1 periodic limb movements per hour of sleep.   Impression: This routine overnight polysomnogram did not demonstrate significant obstructive sleep apnea with only 6 respiratory events observed.    There was a slightly elevated periodic limb movement index of 14.1 periodic limb movements per hour of sleep which resulted in frequent arousals. The  patient reports poorly refreshing sleep and daytime sleepiness. Clinical correlation is suggested.  There was a reduced sleep efficiency with anelevated arousal index,increased awakeningsand a reduced percentage of slow wave sleep.These findings would appear to be due to the limb movements.  Recommendations:     Would recommend evaluation for possible restless leg syndrome.   Stacie Pax, MD, [facility] Diplomate ABMS-Pulmonary, Critical Care and Sleep Medicine  Electronically reviewed and digitally signed     [facility] Polysomnogram Report Part II  Phone: [phone] Fax: [phone]  Patient last name Flores Neck Size 12.0 in. Acquisition [id number]  Patient first name Stacie Weight 132.0 lbs. Started 12/12/2020 at 9:31:41 PM  Birth date 01-Nov-1970 Height 61.0 in. Stopped 12/13/2020 at 5:06:23 AM  Age 64 BMI 24.9 lb/in2 Duration 441.3  Study Type Adult      M.Starke,RPSGT Sleep Data: Lights Out: 9:31:41 PM Sleep Onset: 9:54:11 PM  Lights On: 4:52:59 AM Sleep Efficiency: 80.9 %  Total Recording Time: 441.3 min Sleep  Latency (from Lights Off) 22.5 min  Total Sleep Time (TST): 357.0 min R Latency (from Sleep Onset): 162.5 min  Sleep Period Time: 418.0 min Total number of awakenings: 39  Wake during sleep: 61.0 min Wake After Sleep Onset (WASO): 61.8 min   Sleep Data:         Arousal Summary: Stage  Latency from lights out (min) Latency from sleep onset (min) Duration (min) % Total Sleep Time  Normal values  N 1 22.5 0.0 96.5 27.0 (5%)  N 2 30.5 8.0 161.5 45.2 (50%)  N 3  54.5 32.0 24.0 6.7 (20%)  R 185.0 162.5 75.0 21.0 (25%)    Number Index  Spontaneous 55 9.2  Apneas & Hypopneas 4 0.7  RERAs 0 0.0       (Apneas & Hypopneas & RERAs)  (4) (0.7)  Limb Movement 97 16.3  Snore 0 0.0  TOTAL 156 26.2     Respiratory Data:  CA OA MA Apnea Hypopnea* A+ H RERA Total  Number 0 4 0 4 2 6  0 6  Mean Dur (sec) 0.0 12.5 0.0 12.5 19.3 14.8 0.0 14.8  Max Dur (sec) 0.0 13.5 0.0 13.5 28.0 28.0 0.0 28.0  Total Dur (min) 0.0 0.8 0.0 0.8 0.6 1.5 0.0 1.5  % of TST 0.0 0.2 0.0 0.2 0.2 0.4 0.0 0.4  Index (#/h TST) 0.0 0.7 0.0 0.7 0.3 1.0 0.0 1.0  *Hypopneas scored based on 4% or greater desaturation.  Sleep Stage:        REM NREM TST  AHI 4.0 0.2 1.0  RDI 4.0 0.2 1.0           Body Position Data:  Sleep (min) TST (%) REM (min) NREM (min) CA (#) OA (#) MA (#) HYP (#) AHI (#/h) RERA (#) RDI (#/h) Desat (#)  Supine 197.8 55.41 40.0 157.8 0 4 0 1 1.5 0 1.5 19  Non-Supine 159.20 44.59 35.00 124.20 0.00 0.00 0.00 1.00 0.38 0 0.38 14.00  Left: 93.5 26.19 0.0 93.5 0 0 0 0 0.0 0 0.00 3  Right: 65.7 18.40 35.0 30.7 0 0 0 1 0.9 0 0.9 11     Snoring: Total number of snoring episodes  0  Total time with snoring    min (   % of sleep)   Oximetry Distribution:             WK REM NREM TOTAL  Average (%)   97 97 97 97  < 90% 0.1 0.3 0.0 0.4  < 80% 0.0 0.0 0.0 0.0  < 70% 0.0 0.0 0.0 0.0  # of Desaturations* 4 22 7  33  Desat Index (#/hour) 3.4 17.6 1.5 5.5  Desat Max (%) 21 10 5 21   Desat Max Dur (sec) 12.0 77.0 47.0 77.0  Approx Min O2 during sleep 81  Approx min O2 during a respiratory event 91  Was Oxygen added (Y/N) and final rate No:   0 LPM  *Desaturations based on 4% or greater drop from baseline.   Cheyne Stokes Breathing: None Present   Heart Rate Summary:  Average Heart Rate During Sleep 60.1 bpm      Highest Heart Rate During Sleep (95th %) 74.0 bpm      Highest Heart Rate During Sleep 119 bpm      Highest Heart Rate During Recording  (TIB) 133 bpm       Heart Rate Observations: Event Type # Events   Bradycardia 1 Lowest HR Scored: 60 bpm  Sinus Tachycardia During Sleep 0 Highest HR Scored: N/A  Narrow Complex Tachycardia 0 Highest HR Scored: N/A  Wide Complex Tachycardia 0 Highest HR Scored: N/A  Asystole 0 Longest Pause: N/A  Atrial Fibrillation 0 Duration Longest Event: N/A  Other Arrythmias  No Type:    Periodic Limb Movement Data: (Primary legs unless otherwise noted) Total # Limb Movement 122 Limb Movement Index 20.5  Total # PLMS 84 PLMS Index 14.1  Total # PLMS Arousals 68 PLMS Arousal Index 11.4  Percentage Sleep Time with PLMS 49.51min (13.9 % sleep)  Mean Duration limb movements (secs) 248.5

## 2020-12-21 ENCOUNTER — Other Ambulatory Visit: Payer: Self-pay

## 2020-12-21 ENCOUNTER — Ambulatory Visit
Admission: RE | Admit: 2020-12-21 | Discharge: 2020-12-21 | Disposition: A | Discharge status: Home / Self Care | Payer: BC Managed Care – PPO | Source: Ambulatory Visit | Attending: Physician Assistant | Admitting: Physician Assistant

## 2020-12-21 DIAGNOSIS — G4710 Hypersomnia, unspecified: Secondary | ICD-10-CM | POA: Insufficient documentation

## 2020-12-21 DIAGNOSIS — E1165 Type 2 diabetes mellitus with hyperglycemia: Secondary | ICD-10-CM | POA: Insufficient documentation

## 2020-12-21 DIAGNOSIS — Z8673 Personal history of transient ischemic attack (TIA), and cerebral infarction without residual deficits: Secondary | ICD-10-CM | POA: Insufficient documentation

## 2020-12-21 DIAGNOSIS — R1319 Other dysphagia: Secondary | ICD-10-CM | POA: Diagnosis not present

## 2020-12-21 DIAGNOSIS — R202 Paresthesia of skin: Secondary | ICD-10-CM | POA: Insufficient documentation

## 2020-12-21 DIAGNOSIS — Z0001 Encounter for general adult medical examination with abnormal findings: Secondary | ICD-10-CM | POA: Diagnosis not present

## 2020-12-21 DIAGNOSIS — R300 Dysuria: Secondary | ICD-10-CM | POA: Diagnosis not present

## 2020-12-21 MED ORDER — METFORMIN HCL 1000 MG PO TABS: 1000.0000 mg | ORAL_TABLET | Freq: Every day | ORAL | 1 refills | Status: DC

## 2020-12-22 NOTE — Progress Notes (Signed)
Modified Barium Swallow Progress Note  Patient Details  Name: LINDER PRAJAPATI MRN: [id number] Date of Birth: 08-Sep-1970  Today's Date: 12/22/2020  Modified Barium Swallow completed.  Full report located under Chart Review in the Imaging Section.  Brief recommendations include the following:  Clinical Impression  Pt demonstrates adequate oropharyngeal abilities when consuming thin liquids via cup and straw, puree, graham crackers and whole barium tablet with thin liquids. Pt's oral phase is timely and pharyngeal phase is free of penetration and aspiration. When consuming whole barium tablet, pt reported globus sensation but none observed in imaging. Video playback was provided. At this time, pt appears at reduced risk of aspiration when consuming a regular diet with thin liquids via cup or straw and medicine whole with thin liquids.   Swallow Evaluation Recommendations       SLP Diet Recommendations: Regular solids;Thin liquid   Liquid Administration via: Straw;Cup   Medication Administration: Whole meds with liquid   Supervision: Patient able to self feed   Compensations: Minimize environmental distractions;Slow rate;Small sips/bites   Postural Changes: Remain semi-upright after after feeds/meals (Comment);Seated upright at 90 degrees   Oral Care Recommendations: Oral care BID      Mandeep Kiser B. Dreama Saa M.S., CCC-SLP, [facility] Speech-Language Pathologist Rehabilitation Services Office [phone]   Tiras Bianchini Dreama Saa 12/22/2020,9:21 PM

## 2021-01-07 ENCOUNTER — Other Ambulatory Visit: Payer: Self-pay

## 2021-01-07 ENCOUNTER — Encounter: Payer: Self-pay | Admitting: Physician Assistant

## 2021-01-07 ENCOUNTER — Ambulatory Visit: Payer: Self-pay | Admitting: Physician Assistant

## 2021-01-07 DIAGNOSIS — E1165 Type 2 diabetes mellitus with hyperglycemia: Secondary | ICD-10-CM | POA: Diagnosis not present

## 2021-01-07 DIAGNOSIS — Z23 Encounter for immunization: Secondary | ICD-10-CM

## 2021-01-07 DIAGNOSIS — G2581 Restless legs syndrome: Secondary | ICD-10-CM | POA: Diagnosis not present

## 2021-01-07 MED ORDER — TETANUS-DIPHTH-ACELL PERTUSSIS 5-2.5-18.5 LF-MCG/0.5 IM SUSP: 0.5000 mL | Freq: Once | INTRAMUSCULAR | 0 refills | Status: AC

## 2021-01-07 MED ORDER — ZOSTER VAC RECOMB ADJUVANTED 50 MCG/0.5ML IM SUSR: 0.5000 mL | Freq: Once | INTRAMUSCULAR | 0 refills | Status: AC

## 2021-01-07 NOTE — Progress Notes (Signed)
[facility] [address] [place], [place] [address]  Internal MEDICINE  Office Visit Note  Patient Name: Stacie Flores  [id number]  [id number]  Date of Service: 01/08/2021  Chief Complaint  Patient presents with   Follow-up    review sleep study and barium swallow results and blood work results    Diabetes   Asthma   Quality Metric Gaps    shingrix    HPI Pt is here for routine follow up to discuss lab and test results -Swallow study was normal -Psg neg for OSA, but did show PLMS that may be disrupting sleep. -She had gone vegan and started having burning and tingling in in hands. Started incorporating magnesium with B12 and added some meat back in as well and this has resolved. -Labs showed low calcium, low-normal ferritin, elevated B12--though pt supplementing daily. She is going to start iron and calcium supplement. Discussed that low ferritin can contribute to RLS and supplementing may help improve this as well. -About to start part time and start school, will be able to eat at home and exercise more  Current Medication: Outpatient Encounter Medications as of 01/07/2021  Medication Sig   Ascorbic Acid (VITAMIN C) 1000 MG tablet Take 1,000 mg by mouth daily.   aspirin 81 MG chewable tablet Chew 1 tablet (81 mg total) by mouth daily.   Biotin 1000 MCG tablet Take 1,000 mcg by mouth daily.   cholecalciferol (VITAMIN D3) 25 MCG (1000 UT) tablet Take 1,000 Units by mouth daily.   Cyanocobalamin (CVS B12 GUMMIES PO) Take by mouth.   Flaxseed, Linseed, (GNP FLAX SEED OIL) 1000 MG CAPS Take by mouth.   Magnesium 100 MG TABS Take by mouth.   metFORMIN (GLUCOPHAGE) 1000 MG tablet Take 1 tablet (1,000 mg total) by mouth daily with breakfast.   [DISCONTINUED] Tdap (BOOSTRIX) 5-2.5-18.5 LF-MCG/0.5 injection Inject 0.5 mLs into the muscle once.   [DISCONTINUED] Zoster Vaccine Adjuvanted [facility]) injection Inject 0.5 mLs into the muscle once.   [EXPIRED] Tdap (BOOSTRIX)  5-2.5-18.5 LF-MCG/0.5 injection Inject 0.5 mLs into the muscle once for 1 dose.   [EXPIRED] Zoster Vaccine Adjuvanted [facility]) injection Inject 0.5 mLs into the muscle once for 1 dose.   No facility-administered encounter medications on file as of 01/07/2021.    Surgical History: Past Surgical History:  Procedure Laterality Date   CESAREAN SECTION     LEEP      Medical History: Past Medical History:  Diagnosis Date   Asthma    Diabetes mellitus without complication (HCC)    Stroke (HCC)     Family History: Family History  Problem Relation Age of Onset   Breast cancer Sister        early 69's    Social History   Socioeconomic History   Marital status: Single    Spouse name: Not on file   Number of children: Not on file   Years of education: Not on file   Highest education level: Not on file  Occupational History   Not on file  Tobacco Use   Smoking status: Never   Smokeless tobacco: Never  Substance and Sexual Activity   Alcohol use: No   Drug use: No   Sexual activity: Yes  Other Topics Concern   Not on file  Social History Narrative   Live with family   Social Determinants of Health   Financial Resource Strain: Not on file  Food Insecurity: Not on file  Transportation Needs: Not on file  Physical Activity: Not on file  Stress: Not on file  Social Connections: Not on file  Intimate Partner Violence: Not on file      Review of Systems  Constitutional:  Negative for chills, fatigue and unexpected weight change.  HENT:  Negative for congestion, postnasal drip, rhinorrhea, sneezing and sore throat.   Eyes:  Negative for redness.  Respiratory:  Negative for cough, chest tightness and shortness of breath.   Cardiovascular:  Negative for chest pain and palpitations.  Gastrointestinal:  Negative for abdominal pain, constipation, diarrhea, nausea and vomiting.  Genitourinary:  Negative for dysuria and frequency.  Musculoskeletal:  Negative for  arthralgias, back pain, joint swelling and neck pain.  Skin:  Negative for rash.  Neurological: Negative.  Negative for tremors and numbness.  Hematological:  Negative for adenopathy. Does not bruise/bleed easily.  Psychiatric/Behavioral:  Positive for sleep disturbance. Negative for behavioral problems (Depression) and suicidal ideas. The patient is not nervous/anxious.    Vital Signs: BP 100/72   Pulse 70   Temp 97.7 F (36.5 C)   Resp 16   Ht 5\' 1"  (1.549 m)   Wt 128 lb 12.8 oz (58.4 kg)   SpO2 99%   BMI 24.34 kg/m    Physical Exam Vitals and nursing note reviewed.  Constitutional:      General: She is not in acute distress.    Appearance: She is well-developed and normal weight. She is not diaphoretic.  HENT:     Head: Normocephalic and atraumatic.     Mouth/Throat:     Pharynx: No oropharyngeal exudate.  Eyes:     Pupils: Pupils are equal, round, and reactive to light.  Neck:     Thyroid: No thyromegaly.     Vascular: No JVD.     Trachea: No tracheal deviation.  Cardiovascular:     Rate and Rhythm: Normal rate and regular rhythm.     Heart sounds: Normal heart sounds. No murmur heard.   No friction rub. No gallop.  Pulmonary:     Effort: Pulmonary effort is normal. No respiratory distress.     Breath sounds: No wheezing or rales.  Chest:     Chest wall: No tenderness.  Abdominal:     General: Bowel sounds are normal.     Palpations: Abdomen is soft.  Musculoskeletal:        General: Normal range of motion.     Cervical back: Normal range of motion and neck supple.  Lymphadenopathy:     Cervical: No cervical adenopathy.  Skin:    General: Skin is warm and dry.  Neurological:     Mental Status: She is alert and oriented to person, place, and time.     Cranial Nerves: No cranial nerve deficit.  Psychiatric:        Behavior: Behavior normal.        Thought Content: Thought content normal.        Judgment: Judgment normal.       Assessment/Plan: 1.  RLS (restless legs syndrome) Will supplement iron due to low normal iron/ferritin readings  2. Type 2 diabetes mellitus with hyperglycemia, without long-term current use of insulin (HCC) Continue metformin and improving diet/exercise  3. Low calcium levels Will increase calcium intake  4. Need for Tdap vaccination - Tdap (BOOSTRIX) 5-2.5-18.5 LF-MCG/0.5 injection; Inject 0.5 mLs into the muscle once for 1 dose.  Dispense: 0.5 mL; Refill: 0  5. Need for shingles vaccine - Zoster Vaccine Adjuvanted [facility]) injection; Inject 0.5 mLs  into the muscle once for 1 dose.  Dispense: 0.5 mL; Refill: 0   General Counseling: Ernisha verbalizes understanding of the findings of todays visit and agrees with plan of treatment. I have discussed any further diagnostic evaluation that may be needed or ordered today. We also reviewed her medications today. she has been encouraged to call the office with any questions or concerns that should arise related to todays visit.    No orders of the defined types were placed in this encounter.   Meds ordered this encounter  Medications   Tdap (BOOSTRIX) 5-2.5-18.5 LF-MCG/0.5 injection    Sig: Inject 0.5 mLs into the muscle once for 1 dose.    Dispense:  0.5 mL    Refill:  0   Zoster Vaccine Adjuvanted [facility]) injection    Sig: Inject 0.5 mLs into the muscle once for 1 dose.    Dispense:  0.5 mL    Refill:  0    This patient was seen by Lynn Ito, PA-C in collaboration with Dr. Beverely Risen as a part of collaborative care agreement.   Total time spent:35 Minutes Time spent includes review of chart, medications, test results, and follow up plan with the patient.      Dr Lyndon Code Internal medicine

## 2021-03-11 ENCOUNTER — Ambulatory Visit: Payer: BC Managed Care – PPO | Admitting: Physician Assistant

## 2021-04-01 ENCOUNTER — Ambulatory Visit: Payer: Medicaid Other | Admitting: Physician Assistant

## 2021-04-01 ENCOUNTER — Other Ambulatory Visit: Payer: Self-pay

## 2021-04-01 ENCOUNTER — Encounter: Payer: Self-pay | Admitting: Physician Assistant

## 2021-04-01 DIAGNOSIS — Z23 Encounter for immunization: Secondary | ICD-10-CM

## 2021-04-01 DIAGNOSIS — G2581 Restless legs syndrome: Secondary | ICD-10-CM | POA: Diagnosis not present

## 2021-04-01 DIAGNOSIS — E1165 Type 2 diabetes mellitus with hyperglycemia: Secondary | ICD-10-CM

## 2021-04-01 LAB — POCT GLYCOSYLATED HEMOGLOBIN (HGB A1C): Hemoglobin A1C: 6.9 % — AB (ref 4.0–5.6)

## 2021-04-01 MED ORDER — METFORMIN HCL 1000 MG PO TABS: 1000.0000 mg | ORAL_TABLET | Freq: Two times a day (BID) | ORAL | 1 refills | Status: DC

## 2021-04-01 NOTE — Progress Notes (Signed)
[facility] [address] [place], [place] [address]  Internal MEDICINE  Office Visit Note  Patient Name: Stacie Flores  [id number]  [id number]  Date of Service: 04/02/2021  Chief Complaint  Patient presents with   Follow-up    Pt requested flu vaccine   Diabetes    HPI Pt is here for routine follow up -BG fasting around 140s -Still taking metformin 1000 BID, had tried backing down to once per day at one point but didn't feel as well and went back to BID. -Has not been exercising as much, will also binge eat sweets. 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 -Doing better with eating meat again, but thinks it may be impacting sugar some with changing diet around. Is still taking B12 supplement. Denies anymore RLS or cramping/tingling anymore since switching back. -requests flu vaccine today  Current Medication: Outpatient Encounter Medications as of 04/01/2021  Medication Sig   Ascorbic Acid (VITAMIN C) 1000 MG tablet Take 1,000 mg by mouth daily.   aspirin 81 MG chewable tablet Chew 1 tablet (81 mg total) by mouth daily.   Biotin 1000 MCG tablet Take 1,000 mcg by mouth daily.   cholecalciferol (VITAMIN D3) 25 MCG (1000 UT) tablet Take 1,000 Units by mouth daily.   Cyanocobalamin (CVS B12 GUMMIES PO) Take by mouth.   Flaxseed, Linseed, (GNP FLAX SEED OIL) 1000 MG CAPS Take by mouth.   Magnesium 100 MG TABS Take by mouth.   [DISCONTINUED] metFORMIN (GLUCOPHAGE) 1000 MG tablet Take 1 tablet (1,000 mg total) by mouth daily with breakfast.   metFORMIN (GLUCOPHAGE) 1000 MG tablet Take 1 tablet (1,000 mg total) by mouth 2 (two) times daily with a meal.   No facility-administered encounter medications on file as of 04/01/2021.    Surgical History: Past Surgical History:  Procedure Laterality Date   CESAREAN SECTION     LEEP       Medical History: Past Medical History:  Diagnosis Date   Asthma    Diabetes mellitus without complication (HCC)    Stroke (HCC)     Family History: Family History  Problem Relation Age of Onset   Breast cancer Sister        early 55's    Social History   Socioeconomic History   Marital status: Single    Spouse name: Not on file   Number of children: Not on file   Years of education: Not on file   Highest education level: Not on file  Occupational History   Not on file  Tobacco Use   Smoking status: Never   Smokeless tobacco: Never  Substance and Sexual Activity   Alcohol use: No   Drug use: No   Sexual activity: Yes  Other Topics Concern   Not on file  Social History Narrative   Live with family   Social Determinants of Health   Financial Resource Strain: Not on file  Food Insecurity: Not on file  Transportation Needs: Not on file  Physical Activity: Not on file  Stress: Not on file  Social Connections: Not on file  Intimate Partner Violence: Not on file      Review of Systems  Constitutional:  Negative for chills, fatigue and unexpected weight change.  HENT:  Negative for congestion, postnasal drip, rhinorrhea, sneezing and sore throat.   Eyes:  Negative for redness.  Respiratory:  Negative for cough, chest tightness and shortness of breath.   Cardiovascular:  Negative for chest pain and palpitations.  Gastrointestinal:  Negative for abdominal pain, constipation, diarrhea, nausea and vomiting.  Genitourinary:  Negative for dysuria and frequency.  Musculoskeletal:  Negative for arthralgias, back pain, joint swelling and neck pain.  Skin:  Negative for rash.  Neurological: Negative.  Negative for tremors and numbness.  Hematological:  Negative for adenopathy. Does not bruise/bleed easily.  Psychiatric/Behavioral:  Negative for behavioral problems (Depression), sleep disturbance and suicidal ideas. The patient is not nervous/anxious.    Vital  Signs: BP 107/76   Pulse 99   Temp 98.6 F (37 C)   Resp 16   Ht 5\' 1"  (1.549 m)   Wt 133 lb 9.6 oz (60.6 kg)   SpO2 97%   BMI 25.24 kg/m    Physical Exam Vitals and nursing note reviewed.  Constitutional:      General: She is not in acute distress.    Appearance: She is well-developed and normal weight. She is not diaphoretic.  HENT:     Head: Normocephalic and atraumatic.     Mouth/Throat:     Pharynx: No oropharyngeal exudate.  Eyes:     Pupils: Pupils are equal, round, and reactive to light.  Neck:     Thyroid: No thyromegaly.     Vascular: No JVD.     Trachea: No tracheal deviation.  Cardiovascular:     Rate and Rhythm: Normal rate and regular rhythm.     Heart sounds: Normal heart sounds. No murmur heard.   No friction rub. No gallop.  Pulmonary:     Effort: Pulmonary effort is normal. No respiratory distress.     Breath sounds: No wheezing or rales.  Chest:     Chest wall: No tenderness.  Abdominal:     General: Bowel sounds are normal.     Palpations: Abdomen is soft.  Musculoskeletal:        General: Normal range of motion.     Cervical back: Normal range of motion and neck supple.  Lymphadenopathy:     Cervical: No cervical adenopathy.  Skin:    General: Skin is warm and dry.  Neurological:     Mental Status: She is alert and oriented to person, place, and time.     Cranial Nerves: No cranial nerve deficit.  Psychiatric:        Behavior: Behavior normal.        Thought Content: Thought content normal.        Judgment: Judgment normal.       Assessment/Plan: 1. Type 2 diabetes mellitus with hyperglycemia, without long-term current use of insulin (HCC) - POCT HgB A1C is 6.9 which is increased from 6.6 last visit. Will work on improving diet and exercise and avoiding binge eating sweets. Will continue metformin BID, but may need to add additional med if continuing to rise. - metFORMIN (GLUCOPHAGE) 1000 MG tablet; Take 1 tablet (1,000 mg total) by  mouth 2 (two) times daily with a meal.  Dispense: 180 tablet; Refill: 1  2. RLS (restless legs syndrome) Improved with diet change  3. Flu vaccine need - Flu Vaccine MDCK QUAD PF   General Counseling: Etola verbalizes understanding of the findings of todays visit and agrees with plan of treatment. I have discussed any further diagnostic evaluation that may be needed or ordered today. We also reviewed her medications today. she has been encouraged to call the office with any  questions or concerns that should arise related to todays visit.    Orders Placed This Encounter  Procedures   Flu Vaccine MDCK QUAD PF   POCT HgB A1C    Meds ordered this encounter  Medications   metFORMIN (GLUCOPHAGE) 1000 MG tablet    Sig: Take 1 tablet (1,000 mg total) by mouth 2 (two) times daily with a meal.    Dispense:  180 tablet    Refill:  1    This patient was seen by Lynn Ito, PA-C in collaboration with Dr. Beverely Risen as a part of collaborative care agreement.   Total time spent:30 Minutes Time spent includes review of chart, medications, test results, and follow up plan with the patient.      Dr Lyndon Code Internal medicine

## 2021-04-24 ENCOUNTER — Encounter: Payer: Self-pay | Admitting: Medical

## 2021-04-24 ENCOUNTER — Other Ambulatory Visit: Payer: Self-pay

## 2021-04-24 ENCOUNTER — Ambulatory Visit: Payer: BC Managed Care – PPO | Admitting: Medical

## 2021-04-24 VITALS — BP 102/78 | HR 97 | Temp 98.1°F | Resp 16

## 2021-04-24 DIAGNOSIS — R6889 Other general symptoms and signs: Secondary | ICD-10-CM

## 2021-04-24 DIAGNOSIS — Z20822 Contact with and (suspected) exposure to covid-19: Secondary | ICD-10-CM

## 2021-04-24 DIAGNOSIS — E119 Type 2 diabetes mellitus without complications: Secondary | ICD-10-CM

## 2021-04-24 DIAGNOSIS — H6983 Other specified disorders of Eustachian tube, bilateral: Secondary | ICD-10-CM

## 2021-04-24 DIAGNOSIS — Z1152 Encounter for screening for COVID-19: Secondary | ICD-10-CM

## 2021-04-24 DIAGNOSIS — H6993 Unspecified Eustachian tube disorder, bilateral: Secondary | ICD-10-CM

## 2021-04-24 DIAGNOSIS — U071 COVID-19: Secondary | ICD-10-CM

## 2021-04-24 LAB — POCT INFLUENZA A/B
Influenza A, POC: NEGATIVE
Influenza B, POC: NEGATIVE

## 2021-04-24 LAB — POC COVID19 BINAXNOW: SARS Coronavirus 2 Ag: POSITIVE — AB

## 2021-04-24 MED ORDER — DOXYCYCLINE HYCLATE 100 MG PO TABS: 100.0000 mg | ORAL_TABLET | Freq: Two times a day (BID) | ORAL | 0 refills | Status: DC

## 2021-04-24 NOTE — Patient Instructions (Signed)

## 2021-04-24 NOTE — Progress Notes (Signed)
Subjective:    Patient ID: Stacie Flores, female    DOB: 05-04-1971, 50 y.o.   MRN: [id number]  HPI 50 yo female who presents today with  symptoms including  chills, fever, nasal congestion with green discharge, cough nonproductive, and   malaise, started Sunday night.  Around a person that had the Flu  last Thursday. Stopped taking Metformin ( history iof DM tpe 2) due to her not eating as well, ate oatmeal this am and took her metformin. Taking Mucinex and Iburpfen for symptoms of  illness.    Blood pressure 102/78, pulse 97, temperature 98.1 F (36.7 C), temperature source Tympanic, resp. rate 16, last menstrual period 04/03/2021, SpO2 98 %.  Allergies  Allergen Reactions   Penicillins Hives    Has patient had a PCN reaction causing immediate rash, facial/tongue/throat swelling, SOB or lightheadedness with hypotension: Yes Has patient had a PCN reaction causing severe rash involving mucus membranes or skin necrosis: No Has patient had a PCN reaction that required hospitalization: No Has patient had a PCN reaction occurring within the last 10 years: Yes If all of the above answers are "NO", then may proceed with Cephalosporin use.   History of vertigo  had Sunday night, better now.  Blood pressure 102/78, pulse 97, temperature 98.1 F (36.7 C), temperature source Tympanic, resp. rate 16, last menstrual period 04/03/2021, SpO2 98 %.   Review of Systems  Constitutional:  Positive for chills, fatigue and fever (did not take).  HENT:  Positive for congestion (green discharge), postnasal drip, rhinorrhea and sore throat (scratchy on Saturday, painful on Sunday better now). Negative for sinus pressure, sinus pain and trouble swallowing.   Respiratory:  Positive for cough (green and white). Negative for shortness of breath.   Cardiovascular:  Negative for chest pain.  Gastrointestinal:  Negative for abdominal pain, diarrhea, nausea and vomiting.  Genitourinary:  Negative for difficulty  urinating.  Musculoskeletal:  Positive for myalgias.  Skin:  Negative for color change.  Allergic/Immunologic: Positive for environmental allergies.  Neurological:  Positive for dizziness and headaches. Negative for syncope.      Objective:   Physical Exam Vitals and nursing note reviewed.  Constitutional:      Appearance: Normal appearance. She is normal weight.     Comments: Patient does look like she does not feel well.  HENT:     Head: Normocephalic and atraumatic.     Right Ear: Ear canal and external ear normal.     Left Ear: Ear canal and external ear normal.     Mouth/Throat:     Mouth: Mucous membranes are moist.     Pharynx: Oropharynx is clear.  Eyes:     Extraocular Movements: Extraocular movements intact.     Conjunctiva/sclera: Conjunctivae normal.     Pupils: Pupils are equal, round, and reactive to light.  Cardiovascular:     Rate and Rhythm: Normal rate and regular rhythm.     Heart sounds: No murmur heard.   No friction rub. No gallop.  Pulmonary:     Effort: Pulmonary effort is normal.     Breath sounds: Normal breath sounds.  Musculoskeletal:        General: Normal range of motion.     Cervical back: Normal range of motion and neck supple.  Skin:    General: Skin is warm and dry.  Neurological:     General: No focal deficit present.     Mental Status: She is alert and oriented to person,  place, and time.  Psychiatric:        Mood and Affect: Mood normal.        Behavior: Behavior normal.        Thought Content: Thought content normal.        Judgment: Judgment normal.     CBG 117  Results for orders placed or performed in visit on 04/24/21 (from the past 24 hour(s))  POCT Influenza A/B     Status: Normal   Collection Time: 04/24/21 11:33 AM  Result Value Ref Range   Influenza A, POC Negative Negative   Influenza B, POC Negative Negative  POC COVID-19     Status: Abnormal   Collection Time: 04/24/21 11:33 AM  Result Value Ref Range   SARS  Coronavirus 2 Ag Positive (A) Negative       Assessment & Plan:  Bronchitis Covid-19 DM type 2 Eustachian tube dysfunction bilateral Isolate  Rest , increase fluids , gatorade, OTC Ibuprofen  or Tylenol, for fever or pain,  and  OTC Zyrtec or Claritin, and OTC Flonase per package instructions.Work note provided to patient. Meds ordered this encounter  Medications   doxycycline (VIBRA-TABS) 100 MG tablet    Sig: Take 1 tablet (100 mg total) by mouth 2 (two) times daily.    Dispense:  14 tablet    Refill:  0    Return to clinic on Monday if not improving. Patient verbalizes understanding and has no questions at discharge.

## 2021-07-01 ENCOUNTER — Other Ambulatory Visit: Payer: Self-pay

## 2021-07-01 ENCOUNTER — Encounter: Payer: Self-pay | Admitting: Nurse Practitioner

## 2021-07-01 ENCOUNTER — Ambulatory Visit (INDEPENDENT_AMBULATORY_CARE_PROVIDER_SITE_OTHER): Payer: Self-pay | Admitting: Nurse Practitioner

## 2021-07-01 VITALS — BP 110/66 | HR 67 | Temp 99.2°F | Resp 16 | Ht 61.0 in | Wt 128.6 lb

## 2021-07-01 DIAGNOSIS — E1165 Type 2 diabetes mellitus with hyperglycemia: Secondary | ICD-10-CM

## 2021-07-01 DIAGNOSIS — G2581 Restless legs syndrome: Secondary | ICD-10-CM

## 2021-07-01 LAB — POCT GLYCOSYLATED HEMOGLOBIN (HGB A1C): Hemoglobin A1C: 6.7 % — AB (ref 4.0–5.6)

## 2021-07-01 MED ORDER — METFORMIN HCL 1000 MG PO TABS: 1000.0000 mg | ORAL_TABLET | Freq: Two times a day (BID) | ORAL | 1 refills | Status: DC

## 2021-07-01 NOTE — Progress Notes (Signed)
[facility] [address] [place], [place] [address]  Internal MEDICINE  Office Visit Note  Patient Name: Stacie Flores  [id number]  [id number]  Date of Service: 07/01/2021  Chief Complaint  Patient presents with   Follow-up    Discuss blood sugar   Diabetes   Asthma    HPI Shantale presents for a follow up visit for diabetes and repeat A1C. Her A1C is 6.7 today which is a slight improvement from October 2022 at 6.9. She is taking metformin 1000 mg twice daily with meals and her glucose ranges from 93-178. Her AM glucose has typically been on the lower end at 93-112. She has been and continues to work on her diet modifications. She drinks a lot of water and has worked on cutting out high carb foods. She likes to get out and walk but does not exercise as much when it is really cold outside. As the weather gets warmer, she plans on increasing her physical activity.  She does not have insurance right now. She works part time and cannot get insurance through work. Metformin is affordable. Other diabetic medications could be a better choice but are very expensive especially with no insurance.    Current Medication: Outpatient Encounter Medications as of 07/01/2021  Medication Sig   Ascorbic Acid (VITAMIN C) 1000 MG tablet Take 1,000 mg by mouth daily.   aspirin 81 MG chewable tablet Chew 1 tablet (81 mg total) by mouth daily.   cholecalciferol (VITAMIN D3) 25 MCG (1000 UT) tablet Take 1,000 Units by mouth daily.   Cyanocobalamin ([employer] B12 GUMMIES PO) Take by mouth.   doxycycline (VIBRA-TABS) 100 MG tablet Take 1 tablet (100 mg total) by mouth 2 (two) times daily.   Flaxseed, Linseed, (GNP FLAX SEED OIL) 1000 MG CAPS Take by mouth.   Magnesium 100 MG TABS Take by mouth.   [DISCONTINUED] metFORMIN (GLUCOPHAGE) 1000 MG tablet Take 1 tablet (1,000 mg total) by mouth 2 (two) times daily with a meal.   metFORMIN (GLUCOPHAGE) 1000 MG tablet Take 1 tablet (1,000 mg total) by mouth 2 (two)  times daily with a meal.   [DISCONTINUED] Biotin 1000 MCG tablet Take 1,000 mcg by mouth daily. (Patient not taking: Reported on 04/24/2021)   No facility-administered encounter medications on file as of 07/01/2021.    Surgical History: Past Surgical History:  Procedure Laterality Date   CESAREAN SECTION     LEEP      Medical History: Past Medical History:  Diagnosis Date   Asthma    Diabetes mellitus without complication (HCC)    Stroke (HCC)     Family History: Family History  Problem Relation Age of Onset   Breast cancer Sister        early 31's    Social History   Socioeconomic History   Marital status: Single    Spouse name: Not on file   Number of children: Not on file   Years of education: Not on file   Highest education level: Not on file  Occupational History   Not on file  Tobacco Use   Smoking status: Never   Smokeless tobacco: Never  Substance and Sexual Activity   Alcohol use: No   Drug use: No   Sexual activity: Yes  Other Topics Concern   Not on file  Social History Narrative   Live with family   Social Determinants of Health   Financial Resource Strain: Not on file  Food Insecurity: Not on file  Transportation Needs: Not on file  Physical Activity: Not on file  Stress: Not on file  Social Connections: Not on file  Intimate Partner Violence: Not on file      Review of Systems  Constitutional:  Negative for chills, fatigue and unexpected weight change.  HENT:  Positive for postnasal drip. Negative for congestion, rhinorrhea, sneezing and sore throat.   Eyes:  Negative for redness.  Respiratory:  Negative for cough, chest tightness and shortness of breath.   Cardiovascular:  Negative for chest pain and palpitations.  Gastrointestinal:  Negative for abdominal pain, constipation, diarrhea, nausea and vomiting.  Genitourinary:  Negative for dysuria and frequency.  Musculoskeletal:  Negative for arthralgias, back pain, joint swelling and  neck pain.  Skin:  Negative for rash.  Neurological: Negative.  Negative for tremors and numbness.  Hematological:  Negative for adenopathy. Does not bruise/bleed easily.  Psychiatric/Behavioral:  Negative for behavioral problems (Depression), sleep disturbance and suicidal ideas. The patient is not nervous/anxious.    Vital Signs: Pulse 67    Temp 99.2 F (37.3 C)    Resp 16    Ht 5\' 1"  (1.549 m)    Wt 128 lb 9.6 oz (58.3 kg)    SpO2 98%    BMI 24.30 kg/m    Physical Exam Constitutional:      Appearance: Normal appearance. She is normal weight.  HENT:     Head: Normocephalic and atraumatic.  Eyes:     Pupils: Pupils are equal, round, and reactive to light.  Cardiovascular:     Rate and Rhythm: Normal rate and regular rhythm.  Pulmonary:     Effort: Pulmonary effort is normal. No respiratory distress.  Neurological:     Mental Status: She is alert and oriented to person, place, and time.     Cranial Nerves: No cranial nerve deficit.     Coordination: Coordination normal.     Gait: Gait normal.  Psychiatric:        Mood and Affect: Mood normal.        Behavior: Behavior normal.       Assessment/Plan: 1. Type 2 diabetes mellitus with hyperglycemia, without long-term current use of insulin (HCC) A1C slightly improved, refills ordered, continue metformin as prescribed. Continue to work on diet and physical activity. Repeat A1C in 3 months.  - POCT HgB A1C - metFORMIN (GLUCOPHAGE) 1000 MG tablet; Take 1 tablet (1,000 mg total) by mouth 2 (two) times daily with a meal.  Dispense: 180 tablet; Refill: 1  2. RLS (restless legs syndrome) Takes magnesium supplement. Discussed medications for restless legs. Problem is manageable right now, will call if it gets worse.    General Counseling: Bayley verbalizes understanding of the findings of todays visit and agrees with plan of treatment. I have discussed any further diagnostic evaluation that may be needed or ordered today. We also  reviewed her medications today. she has been encouraged to call the office with any questions or concerns that should arise related to todays visit.    Orders Placed This Encounter  Procedures   POCT HgB A1C    Meds ordered this encounter  Medications   metFORMIN (GLUCOPHAGE) 1000 MG tablet    Sig: Take 1 tablet (1,000 mg total) by mouth 2 (two) times daily with a meal.    Dispense:  180 tablet    Refill:  1    Return in about 3 months (around 09/29/2021) for F/U, Recheck A1C, lauren or Maleeha Halls.   Total time spent:30  Minutes Time spent includes review of chart, medications, test results, and follow up plan with the patient.   [place] Controlled Substance Database was reviewed by me.  This patient was seen by Sallyanne Kuster, FNP-C in collaboration with Dr. Beverely Risen as a part of collaborative care agreement.   Dorean Hiebert R. Tedd Sias, MSN, FNP-C Internal medicine

## 2021-09-30 ENCOUNTER — Encounter: Payer: Self-pay | Admitting: Physician Assistant

## 2021-09-30 ENCOUNTER — Ambulatory Visit (INDEPENDENT_AMBULATORY_CARE_PROVIDER_SITE_OTHER): Payer: Self-pay | Admitting: Physician Assistant

## 2021-09-30 VITALS — BP 118/73 | HR 82 | Temp 98.4°F | Resp 16 | Ht 61.0 in | Wt 126.7 lb

## 2021-09-30 DIAGNOSIS — Z1231 Encounter for screening mammogram for malignant neoplasm of breast: Secondary | ICD-10-CM

## 2021-09-30 DIAGNOSIS — Z1212 Encounter for screening for malignant neoplasm of rectum: Secondary | ICD-10-CM

## 2021-09-30 DIAGNOSIS — Z1211 Encounter for screening for malignant neoplasm of colon: Secondary | ICD-10-CM

## 2021-09-30 DIAGNOSIS — E1165 Type 2 diabetes mellitus with hyperglycemia: Secondary | ICD-10-CM

## 2021-09-30 LAB — POCT GLYCOSYLATED HEMOGLOBIN (HGB A1C): Hemoglobin A1C: 6.6 % — AB (ref 4.0–5.6)

## 2021-09-30 NOTE — Progress Notes (Signed)
[facility] ?[address] ?[place], [place] [id number] ? ?Internal MEDICINE  ?Office Visit Note ? ?Patient Name: Stacie BristleDwana L Corredor ? [id number] 17, 2072  ?[id number] ? ?Date of Service: 10/01/2021 ? ?Chief Complaint  ?Patient presents with  ? Follow-up  ? Diabetes  ? ? ?HPI ?Pt is here for routine follow up ?-Walking more and drinking more water ?-she is taking Metformin 1000mg  BID ?-Craving sweets a little more ?-When she doesn't eat dairy she has found that doesn't crave sugar as much ?-doesnt have insurance anymore and is due for mammogram and colonoscopy and will see if any assistance programs available for preventive health screenings. May consider cologuard as alternative since no Fhx colon cancer ?-also due for eye exam ? ?Current Medication: ?Outpatient Encounter Medications as of 09/30/2021  ?Medication Sig  ? Ascorbic Acid (VITAMIN C) 1000 MG tablet Take 1,000 mg by mouth daily.  ? aspirin 81 MG chewable tablet Chew 1 tablet (81 mg total) by mouth daily.  ? cholecalciferol (VITAMIN D3) 25 MCG (1000 UT) tablet Take 1,000 Units by mouth daily.  ? Cyanocobalamin (CVS B12 GUMMIES PO) Take by mouth.  ? doxycycline (VIBRA-TABS) 100 MG tablet Take 1 tablet (100 mg total) by mouth 2 (two) times daily.  ? Flaxseed, Linseed, (GNP FLAX SEED OIL) 1000 MG CAPS Take by mouth.  ? Magnesium 100 MG TABS Take by mouth.  ? metFORMIN (GLUCOPHAGE) 1000 MG tablet Take 1 tablet (1,000 mg total) by mouth 2 (two) times daily with a meal.  ? ?No facility-administered encounter medications on file as of 09/30/2021.  ? ? ?Surgical History: ?Past Surgical History:  ?Procedure Laterality Date  ? CESAREAN SECTION    ? LEEP    ? ? ?Medical History: ?Past Medical History:  ?Diagnosis Date  ? Asthma   ? Diabetes mellitus without complication (HCC)   ? Stroke [facility](HCC)   ? ? ?Family History: ?Family History  ?Problem Relation Age of Onset  ? Breast cancer Sister   ?     early 3650's  ? ? ?Social History  ? ?Socioeconomic History  ? Marital status: Single  ?   Spouse name: Not on file  ? Number of children: Not on file  ? Years of education: Not on file  ? Highest education level: Not on file  ?Occupational History  ? Not on file  ?Tobacco Use  ? Smoking status: Never  ? Smokeless tobacco: Never  ?Substance and Sexual Activity  ? Alcohol use: No  ? Drug use: No  ? Sexual activity: Yes  ?Other Topics Concern  ? Not on file  ?Social History Narrative  ? Live with family  ? ?Social Determinants of Health  ? ?Financial Resource Strain: Not on file  ?Food Insecurity: Not on file  ?Transportation Needs: Not on file  ?Physical Activity: Not on file  ?Stress: Not on file  ?Social Connections: Not on file  ?Intimate Partner Violence: Not on file  ? ? ? ? ?Review of Systems  ?Constitutional:  Negative for chills, fatigue and unexpected weight change.  ?HENT:  Negative for congestion, postnasal drip, rhinorrhea, sneezing and sore throat.   ?Eyes:  Negative for redness.  ?Respiratory:  Negative for cough, chest tightness and shortness of breath.   ?Cardiovascular:  Negative for chest pain and palpitations.  ?Gastrointestinal:  Negative for abdominal pain, constipation, diarrhea, nausea and vomiting.  ?Genitourinary:  Negative for dysuria and frequency.  ?Musculoskeletal:  Negative for arthralgias, back pain, joint swelling and neck pain.  ?Skin:  Negative for rash.  ?Neurological: Negative.  Negative for tremors and numbness.  ?Hematological:  Negative for adenopathy. Does not bruise/bleed easily.  ?Psychiatric/Behavioral:  Negative for behavioral problems (Depression), sleep disturbance and suicidal ideas. The patient is not nervous/anxious.   ? ?Vital Signs: ?BP 118/73   Pulse 82   Temp 98.4 ?F (36.9 ?C)   Resp 16   Ht 5\' 1"  (1.549 m)   Wt 126 lb 11.2 oz (57.5 kg)   SpO2 98%   BMI 23.94 kg/m?  ? ? ?Physical Exam ?Vitals and nursing note reviewed.  ?Constitutional:   ?   General: She is not in acute distress. ?   Appearance: She is well-developed and normal weight. She is  not diaphoretic.  ?HENT:  ?   Head: Normocephalic and atraumatic.  ?   Mouth/Throat:  ?   Pharynx: No oropharyngeal exudate.  ?Eyes:  ?   Pupils: Pupils are equal, round, and reactive to light.  ?Neck:  ?   Thyroid: No thyromegaly.  ?   Vascular: No JVD.  ?   Trachea: No tracheal deviation.  ?Cardiovascular:  ?   Rate and Rhythm: Normal rate and regular rhythm.  ?   Heart sounds: Normal heart sounds. No murmur heard. ?  No friction rub. No gallop.  ?Pulmonary:  ?   Effort: Pulmonary effort is normal. No respiratory distress.  ?   Breath sounds: No wheezing or rales.  ?Chest:  ?   Chest wall: No tenderness.  ?Abdominal:  ?   General: Bowel sounds are normal.  ?   Palpations: Abdomen is soft.  ?Musculoskeletal:     ?   General: Normal range of motion.  ?   Cervical back: Normal range of motion and neck supple.  ?Lymphadenopathy:  ?   Cervical: No cervical adenopathy.  ?Skin: ?   General: Skin is warm and dry.  ?Neurological:  ?   Mental Status: She is alert and oriented to person, place, and time.  ?   Cranial Nerves: No cranial nerve deficit.  ?Psychiatric:     ?   Behavior: Behavior normal.     ?   Thought Content: Thought content normal.     ?   Judgment: Judgment normal.  ? ? ? ? ? ?Assessment/Plan: ?1. Type 2 diabetes mellitus with hyperglycemia, without long-term current use of insulin (HCC) ?- POCT HgB A1C is 6.6 which is slightly improved from 6.7 last check. Will continue current medications and work on diet and exercise further ? ?2. Screening for colorectal cancer ?May be interested in cologuard instead depending on cost without insurance ?- Ambulatory referral to Gastroenterology ? ?3. Visit for screening mammogram ?- MM DIGITAL SCREENING BILATERAL; Future ? ? ?General Counseling: Merissa verbalizes understanding of the findings of todays visit and agrees with plan of treatment. I have discussed any further diagnostic evaluation that may be needed or ordered today. We also reviewed her medications today. she  has been encouraged to call the office with any questions or concerns that should arise related to todays visit. ? ? ? ?Orders Placed This Encounter  ?Procedures  ? MM DIGITAL SCREENING BILATERAL  ? Ambulatory referral to Gastroenterology  ? POCT HgB A1C  ? ? ?No orders of the defined types were placed in this encounter. ? ? ?This patient was seen by , PA-C in collaboration with Dr. Lynn Ito as a part of collaborative care agreement. ? ? ?Total time spent:30 Minutes ?Time spent includes review of chart, medications,  test results, and follow up plan with the patient.  ? ? ? ? ?Dr Lyndon Code ?Internal medicine  ?

## 2021-11-21 ENCOUNTER — Other Ambulatory Visit: Payer: Medicaid Other | Admitting: Physician Assistant

## 2021-11-28 ENCOUNTER — Other Ambulatory Visit: Payer: BC Managed Care – PPO | Admitting: Physician Assistant

## 2021-12-30 ENCOUNTER — Other Ambulatory Visit: Payer: Medicaid Other | Admitting: Physician Assistant

## 2022-01-21 IMAGING — MG DIGITAL SCREENING BILAT W/ TOMO W/ CAD
6 of 10 series · 6 of 30 positions shown · non-contrast
Comparison: Previous exam(s).

CLINICAL DATA: Screening.

EXAM:
DIGITAL SCREENING BILATERAL MAMMOGRAM WITH TOMO AND CAD

[R CC synth-2D (1 of 2)]
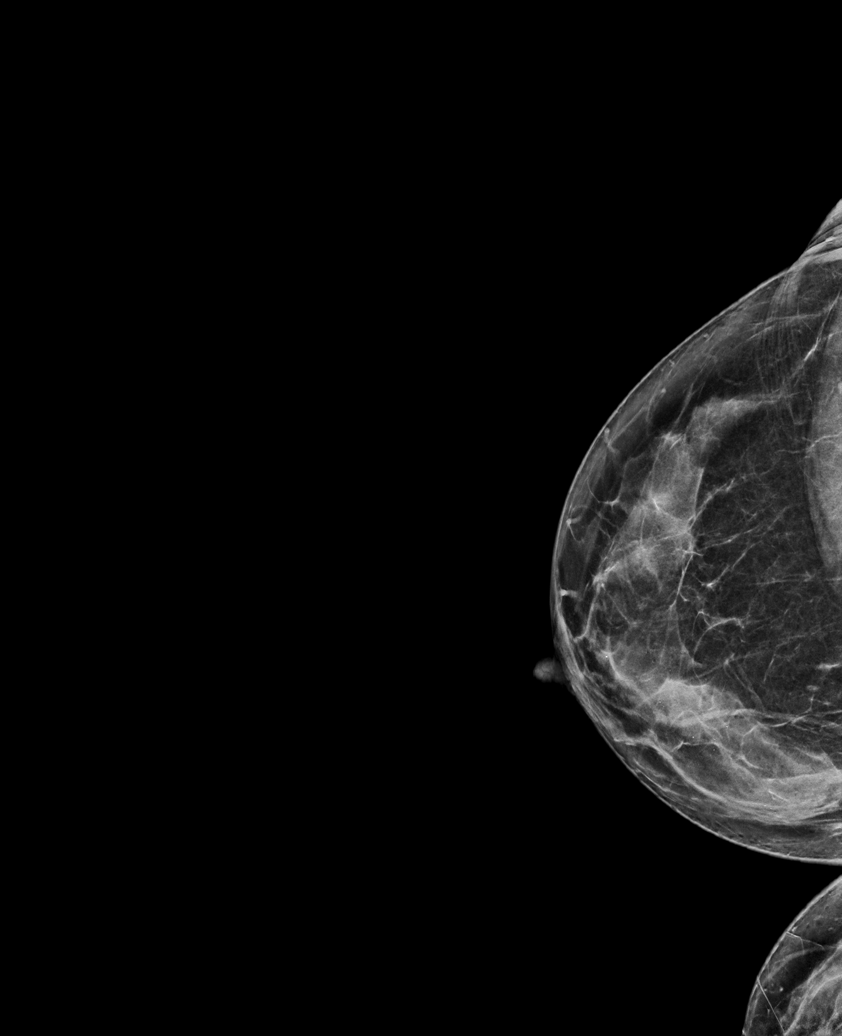

[L CC synth-2D]
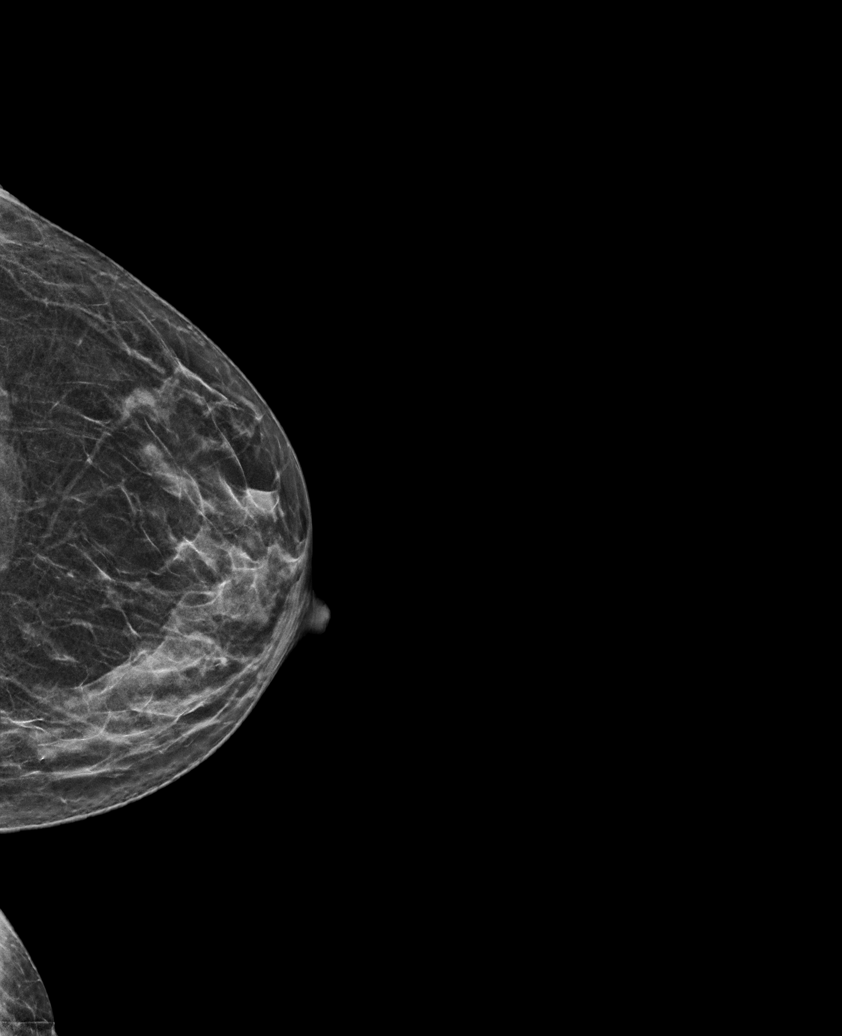

[L MLO synth-2D]
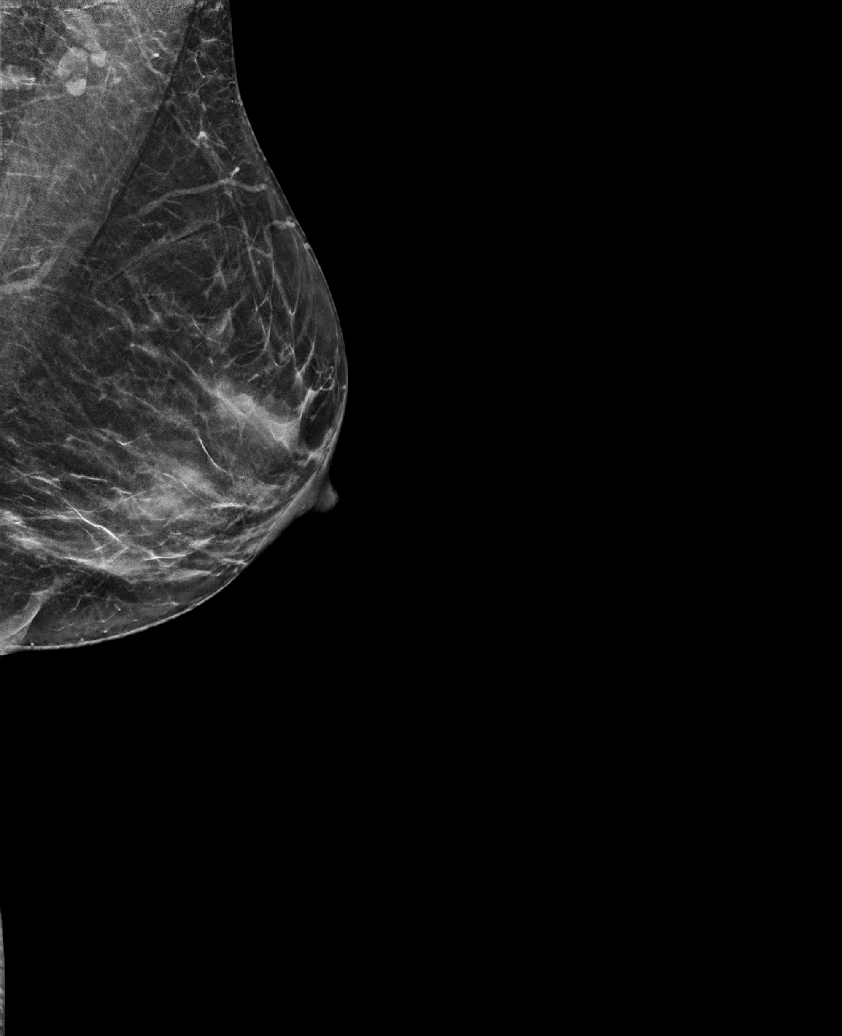

[R CC synth-2D (2 of 2)]
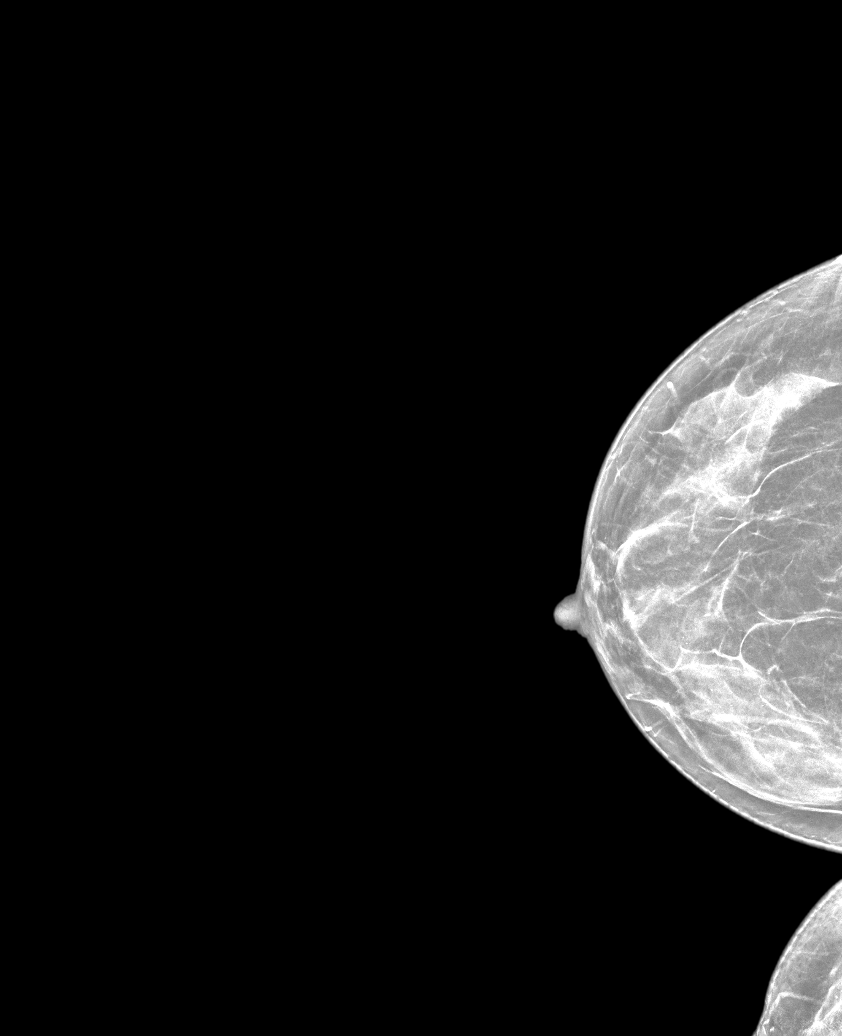

[R MLO synth-2D]
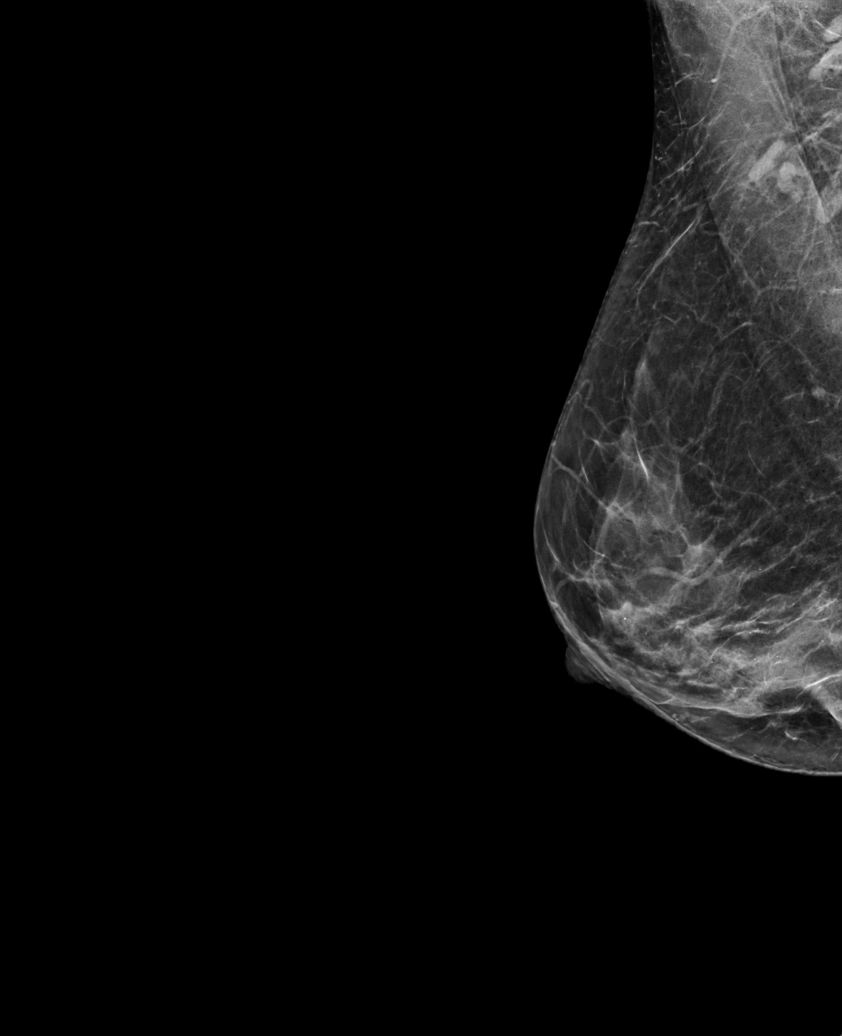

[L MLO tomo · tomo slice 30/59.0]
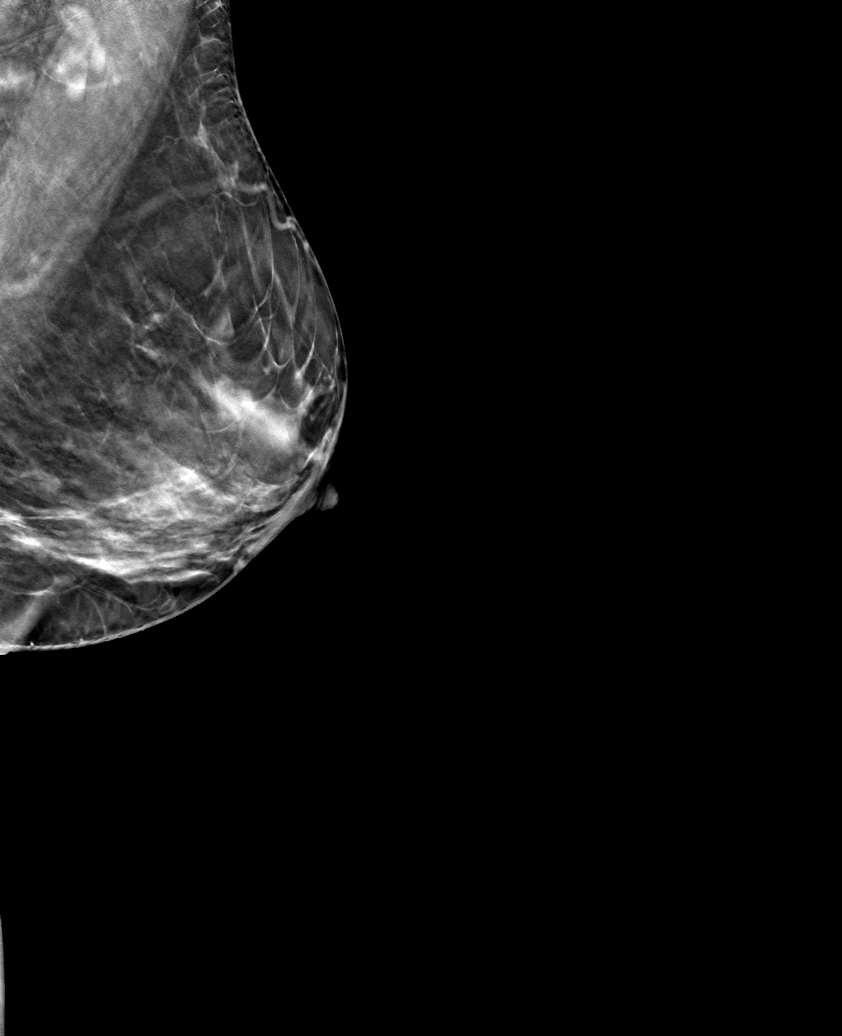

[6 of 30 positions shown; findings below may reference images not displayed]

ACR Breast Density Category c: The breast tissue is heterogeneously
dense, which may obscure small masses.
FINDINGS: There are no findings suspicious for malignancy. Images were
processed with CAD.
IMPRESSION: No mammographic evidence of malignancy. A result letter of this
screening mammogram will be mailed directly to the patient.

RECOMMENDATION:
Screening mammogram in one year. (Code:FT-U-LHB)

BI-RADS CATEGORY  1: Negative.

## 2022-01-24 ENCOUNTER — Encounter: Payer: Self-pay | Admitting: Physician Assistant

## 2022-01-24 ENCOUNTER — Ambulatory Visit (INDEPENDENT_AMBULATORY_CARE_PROVIDER_SITE_OTHER): Payer: 59 | Admitting: Physician Assistant

## 2022-01-24 ENCOUNTER — Other Ambulatory Visit: Payer: Self-pay

## 2022-01-24 ENCOUNTER — Other Ambulatory Visit: Payer: Self-pay | Admitting: Physician Assistant

## 2022-01-24 ENCOUNTER — Telehealth: Payer: Self-pay

## 2022-01-24 DIAGNOSIS — R946 Abnormal results of thyroid function studies: Secondary | ICD-10-CM | POA: Diagnosis not present

## 2022-01-24 DIAGNOSIS — R5383 Other fatigue: Secondary | ICD-10-CM | POA: Diagnosis not present

## 2022-01-24 DIAGNOSIS — R300 Dysuria: Secondary | ICD-10-CM | POA: Diagnosis not present

## 2022-01-24 DIAGNOSIS — Z1211 Encounter for screening for malignant neoplasm of colon: Secondary | ICD-10-CM | POA: Diagnosis not present

## 2022-01-24 DIAGNOSIS — Z1231 Encounter for screening mammogram for malignant neoplasm of breast: Secondary | ICD-10-CM

## 2022-01-24 DIAGNOSIS — E538 Deficiency of other specified B group vitamins: Secondary | ICD-10-CM | POA: Diagnosis not present

## 2022-01-24 DIAGNOSIS — E1165 Type 2 diabetes mellitus with hyperglycemia: Secondary | ICD-10-CM | POA: Diagnosis not present

## 2022-01-24 DIAGNOSIS — E559 Vitamin D deficiency, unspecified: Secondary | ICD-10-CM | POA: Diagnosis not present

## 2022-01-24 DIAGNOSIS — Z0001 Encounter for general adult medical examination with abnormal findings: Secondary | ICD-10-CM

## 2022-01-24 DIAGNOSIS — E782 Mixed hyperlipidemia: Secondary | ICD-10-CM | POA: Diagnosis not present

## 2022-01-24 DIAGNOSIS — Z1212 Encounter for screening for malignant neoplasm of rectum: Secondary | ICD-10-CM

## 2022-01-24 LAB — POCT GLYCOSYLATED HEMOGLOBIN (HGB A1C): Hemoglobin A1C: 6.9 % — AB (ref 4.0–5.6)

## 2022-01-24 MED ORDER — NA SULFATE-K SULFATE-MG SULF 17.5-3.13-1.6 GM/177ML PO SOLN: 1.0000 | Freq: Once | ORAL | 0 refills | Status: AC

## 2022-01-24 MED ORDER — ZOSTER VAC RECOMB ADJUVANTED 50 MCG/0.5ML IM SUSR: 0.5000 mL | Freq: Once | INTRAMUSCULAR | 0 refills | Status: AC

## 2022-01-24 NOTE — Progress Notes (Signed)
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 -Has been having more smoothies and that may have driven sugar up -breast exam done -eye exam on sept 13 -foot exam done -BP low but not symptomatic and hasnt had any water yet today -due for mammogram, colonoscopy and shingles vaccine  Current Medication: Outpatient Encounter Medications as of 01/24/2022  Medication Sig Note   Ascorbic Acid (VITAMIN C) 1000 MG tablet Take 1,000 mg by mouth daily.    cholecalciferol (VITAMIN D3) 25 MCG (1000 UT) tablet Take 1,000 Units by mouth daily.    Cyanocobalamin (CVS B12 GUMMIES PO) Take by mouth.    doxycycline (VIBRA-TABS) 100 MG tablet Take 1 tablet (100 mg total) by mouth 2 (two) times daily.    Flaxseed, Linseed, (GNP FLAX SEED OIL) 1000 MG CAPS Take by mouth.    Magnesium 100 MG TABS Take by mouth.    metFORMIN (GLUCOPHAGE) 1000 MG tablet Take 1 tablet (1,000 mg total) by mouth 2 (two) times daily with a meal.    [DISCONTINUED] aspirin 81 MG chewable tablet Chew 1 tablet (81 mg total) by mouth daily. 01/24/2022: patient no longer taking   [DISCONTINUED] Zoster Vaccine Adjuvanted [facility]) injection Inject 0.5 mLs into the muscle once.    [EXPIRED] Zoster Vaccine Adjuvanted [facility]) injection Inject 0.5 mLs into the muscle once for 1 dose.    No facility-administered encounter medications on file as of 01/24/2022.    Surgical History: Past Surgical History:  Procedure Laterality Date   CESAREAN SECTION     LEEP      Medical  History: Past Medical History:  Diagnosis Date   Asthma    Diabetes mellitus without complication ([place])    Stroke ([place])     Family History: Family History  Problem Relation Age of Onset   Breast cancer Sister        early 79's      Review of Systems  Constitutional:  Negative for chills, fatigue and unexpected weight change.  HENT:  Negative for congestion, postnasal drip, rhinorrhea, sneezing and sore throat.   Eyes:  Negative for redness.  Respiratory:  Negative for cough, chest tightness and shortness of breath.   Cardiovascular:  Negative for chest pain and palpitations.  Gastrointestinal:  Negative for abdominal pain, constipation, diarrhea, nausea and vomiting.  Genitourinary:  Negative for dysuria and frequency.  Musculoskeletal:  Negative for arthralgias, back pain, joint swelling and neck pain.  Skin:  Negative for rash.  Neurological: Negative.  Negative for tremors and numbness.  Hematological:  Negative for adenopathy. Does not bruise/bleed easily.  Psychiatric/Behavioral:  Negative for behavioral problems (Depression), sleep disturbance and suicidal ideas. The patient is not nervous/anxious.      Vital Signs: BP (!) 105/59   Pulse 73   Temp 98.1 F (36.7 C)   Resp 16   Ht 5' 1"  (1.549 m)   Wt 128 lb 12.8 oz (58.4 kg)   SpO2 97%   BMI 24.34 kg/m    Physical Exam Vitals and nursing  note reviewed.  Constitutional:      General: She is not in acute distress.    Appearance: She is well-developed and normal weight. She is not diaphoretic.  HENT:     Head: Normocephalic and atraumatic.     Mouth/Throat:     Pharynx: No oropharyngeal exudate.  Eyes:     Pupils: Pupils are equal, round, and reactive to light.  Neck:     Thyroid: No thyromegaly.     Vascular: No JVD.     Trachea: No tracheal deviation.  Cardiovascular:     Rate and Rhythm: Normal rate and regular rhythm.     Pulses:          Dorsalis pedis pulses are 3+ on the right side and 3+ on  the left side.       Posterior tibial pulses are 3+ on the right side and 3+ on the left side.     Heart sounds: Normal heart sounds. No murmur heard.    No friction rub. No gallop.  Pulmonary:     Effort: Pulmonary effort is normal. No respiratory distress.     Breath sounds: No wheezing or rales.  Chest:     Chest wall: No tenderness.  Breasts:    Right: Normal. No mass.     Left: Normal. No mass.  Abdominal:     General: Bowel sounds are normal.     Palpations: Abdomen is soft.     Tenderness: There is no abdominal tenderness.  Musculoskeletal:        General: Normal range of motion.     Cervical back: Normal range of motion and neck supple.     Right foot: Normal range of motion.     Left foot: Normal range of motion.  Feet:     Right foot:     Protective Sensation: 2 sites tested.  2 sites sensed.     Skin integrity: Skin integrity normal.     Toenail Condition: Right toenails are normal.     Left foot:     Protective Sensation: 2 sites tested.  2 sites sensed.     Skin integrity: Skin integrity normal.     Toenail Condition: Left toenails are normal.  Lymphadenopathy:     Cervical: No cervical adenopathy.  Skin:    General: Skin is warm and dry.  Neurological:     Mental Status: She is alert and oriented to person, place, and time.     Cranial Nerves: No cranial nerve deficit.  Psychiatric:        Behavior: Behavior normal.        Thought Content: Thought content normal.        Judgment: Judgment normal.      LABS: Recent Results (from the past 2160 hour(s))  POCT HgB A1C     Status: Abnormal   Collection Time: 01/24/22 10:09 AM  Result Value Ref Range   Hemoglobin A1C 6.9 (A) 4.0 - 5.6 %   HbA1c POC (<> result, manual entry)     HbA1c, POC (prediabetic range)     HbA1c, POC (controlled diabetic range)    UA/M w/rflx Culture, Routine     Status: Abnormal   Collection Time: 01/24/22 11:40 AM   Specimen: Urine   Urine  Result Value Ref Range   Specific  Gravity, UA      >=1.030 (A) 1.005 - 1.030   pH, UA 5.5 5.0 - 7.5   Color, UA Yellow Yellow   Appearance  Ur Clear Clear   Leukocytes,UA Negative Negative   Protein,UA Negative Negative/Trace   Glucose, UA Negative Negative   Ketones, UA Negative Negative   RBC, UA Negative Negative   Bilirubin, UA Negative Negative   Urobilinogen, Ur 0.2 0.2 - 1.0 mg/dL   Nitrite, UA Negative Negative   Microscopic Examination Comment     Comment: Microscopic follows if indicated.   Microscopic Examination See below:     Comment: Microscopic was indicated and was performed.   Urinalysis Reflex Comment     Comment: This specimen will not reflex to a Urine Culture.  Microscopic Examination     Status: None   Collection Time: 01/24/22 11:40 AM   Urine  Result Value Ref Range   WBC, UA None seen 0 - 5 /hpf   RBC, Urine 0-2 0 - 2 /hpf   Epithelial Cells (non renal) 0-10 0 - 10 /hpf   Casts None seen None seen /lpf   Bacteria, UA Few None seen/Few  Microalbumin, urine     Status: None   Collection Time: 01/24/22 11:40 AM  Result Value Ref Range   Microalbumin, Urine <3.0 Not Estab. ug/mL  CBC w/Diff/Platelet     Status: Abnormal   Collection Time: 01/28/22  8:21 AM  Result Value Ref Range   WBC 7.0 3.4 - 10.8 x10E3/uL   RBC 4.67 3.77 - 5.28 x10E6/uL   Hemoglobin 12.4 11.1 - 15.9 g/dL   Hematocrit 39.6 34.0 - 46.6 %   MCV 85 79 - 97 fL   MCH 26.6 26.6 - 33.0 pg   MCHC 31.3 (L) 31.5 - 35.7 g/dL   RDW 12.6 11.7 - 15.4 %   Platelets 224 150 - 450 x10E3/uL   Neutrophils 44 Not Estab. %   Lymphs 46 Not Estab. %   Monocytes 7 Not Estab. %   Eos 1 Not Estab. %   Basos 1 Not Estab. %   Neutrophils Absolute 3.1 1.4 - 7.0 x10E3/uL   Lymphocytes Absolute 3.2 (H) 0.7 - 3.1 x10E3/uL   Monocytes Absolute 0.5 0.1 - 0.9 x10E3/uL   EOS (ABSOLUTE) 0.1 0.0 - 0.4 x10E3/uL   Basophils Absolute 0.0 0.0 - 0.2 x10E3/uL   Immature Granulocytes 1 Not Estab. %   Immature Grans (Abs) 0.1 0.0 - 0.1 x10E3/uL   Comprehensive metabolic panel     Status: Abnormal   Collection Time: 01/28/22  8:21 AM  Result Value Ref Range   Glucose 140 (H) 70 - 99 mg/dL   BUN 10 6 - 24 mg/dL   Creatinine, Ser 0.80 0.57 - 1.00 mg/dL   eGFR 89 >59 mL/min/1.73   BUN/Creatinine Ratio 13 9 - 23   Sodium 139 134 - 144 mmol/L   Potassium 4.7 3.5 - 5.2 mmol/L   Chloride 105 96 - 106 mmol/L   CO2 23 20 - 29 mmol/L   Calcium 8.8 8.7 - 10.2 mg/dL   Total Protein 6.9 6.0 - 8.5 g/dL   Albumin 4.0 3.8 - 4.9 g/dL   Globulin, Total 2.9 1.5 - 4.5 g/dL   Albumin/Globulin Ratio 1.4 1.2 - 2.2   Bilirubin Total 0.4 0.0 - 1.2 mg/dL   Alkaline Phosphatase 72 44 - 121 IU/L   AST 16 0 - 40 IU/L   ALT 9 0 - 32 IU/L  TSH + free T4     Status: None   Collection Time: 01/28/22  8:21 AM  Result Value Ref Range   TSH 1.750 0.450 - 4.500 uIU/mL  Free T4 1.16 0.82 - 1.77 ng/dL  Lipid Panel With LDL/HDL Ratio     Status: None   Collection Time: 01/28/22  8:21 AM  Result Value Ref Range   Cholesterol, Total 163 100 - 199 mg/dL   Triglycerides 70 0 - 149 mg/dL   HDL 66 >39 mg/dL   VLDL Cholesterol Cal 14 5 - 40 mg/dL   LDL Chol Calc (NIH) 83 0 - 99 mg/dL   LDL/HDL Ratio 1.3 0.0 - 3.2 ratio    Comment:                                     LDL/HDL Ratio                                             Men  Women                               1/2 Avg.Risk  1.0    1.5                                   Avg.Risk  3.6    3.2                                2X Avg.Risk  6.2    5.0                                3X Avg.Risk  8.0    6.1   VITAMIN D 25 Hydroxy (Vit-D Deficiency, Fractures)     Status: None   Collection Time: 01/28/22  8:21 AM  Result Value Ref Range   Vit D, 25-Hydroxy 38.2 30.0 - 100.0 ng/mL    Comment: Vitamin D deficiency has been defined by the [place] practice guideline as a level of serum 25-OH vitamin D less than 20 ng/mL (1,2). The Endocrine Society went on to further define  vitamin D insufficiency as a level between 21 and 29 ng/mL (2). 1. IOM (Institute of Medicine). 2010. Dietary reference    intakes for calcium and D. [place]: The    [employer]. 2. Holick MF, Binkley Drexel, Bischoff-Ferrari HA, et al.    Evaluation, treatment, and prevention of vitamin D    deficiency: an Endocrine Society clinical practice    guideline. JCEM. 2011 Jul; 96(7):1911-30.   B12 and Folate Panel     Status: Abnormal   Collection Time: 01/28/22  8:21 AM  Result Value Ref Range   Vitamin B-12 >2000 (H) 232 - 1245 pg/mL   Folate 14.2 >3.0 ng/mL    Comment: A serum folate concentration of less than 3.1 ng/mL is considered to represent clinical deficiency.   Fe+TIBC+Fer     Status: None   Collection Time: 01/28/22  8:21 AM  Result Value Ref Range   Total Iron Binding Capacity 372 250 - 450 ug/dL   UIBC 305 131 - 425 ug/dL   Iron 67 27 - 159 ug/dL   Iron Saturation 18 15 -  55 %   Ferritin 20 15 - 150 ng/mL  Microalbumin, urine     Status: None   Collection Time: 01/28/22  8:21 AM  Result Value Ref Range   Microalbumin, Urine 23.9 Not Estab. ug/mL        Assessment/Plan: 1. Encounter for general adult medical examination with abnormal findings Cpe performed, routine fasting labs ordered, due for mammogram and colonoscopy  2. Type 2 diabetes mellitus with hyperglycemia, without long-term current use of insulin (HCC) - POCT HgB A1C is 6.9 which is up from 6.6 last check. Will continue current medication and will work on improving diet and exercise - Microalbumin, urine - Microalbumin, urine  3. Other fatigue - CBC w/Diff/Platelet - Comprehensive metabolic panel - TSH + free T4 - Lipid Panel With LDL/HDL Ratio - VITAMIN D 25 Hydroxy (Vit-D Deficiency, Fractures) - B12 and Folate Panel - Fe+TIBC+Fer  4. Vitamin D deficiency - VITAMIN D 25 Hydroxy (Vit-D Deficiency, Fractures)  5. B12 deficiency - B12 and Folate Panel  6. Mixed  hyperlipidemia - Lipid Panel With LDL/HDL Ratio  7. Abnormal thyroid exam - TSH + free T4  8. Visit for screening mammogram - MM 3D SCREEN BREAST BILATERAL; Future  9. Screening for colorectal cancer - Ambulatory referral to Gastroenterology  10. Dysuria - UA/M w/rflx Culture, Routine - Microscopic Examination   General Counseling: Brei verbalizes understanding of the findings of todays visit and agrees with plan of treatment. I have discussed any further diagnostic evaluation that may be needed or ordered today. We also reviewed her medications today. she has been encouraged to call the office with any questions or concerns that should arise related to todays visit.    Counseling:    Orders Placed This Encounter  Procedures   Microscopic Examination   MM 3D SCREEN BREAST BILATERAL   UA/M w/rflx Culture, Routine   Microalbumin, urine   CBC w/Diff/Platelet   Comprehensive metabolic panel   TSH + free T4   Lipid Panel With LDL/HDL Ratio   VITAMIN D 25 Hydroxy (Vit-D Deficiency, Fractures)   B12 and Folate Panel   Fe+TIBC+Fer   Microalbumin, urine   Microalbumin, urine   Ambulatory referral to Gastroenterology   POCT HgB A1C    Meds ordered this encounter  Medications   Zoster Vaccine Adjuvanted [facility]) injection    Sig: Inject 0.5 mLs into the muscle once for 1 dose.    Dispense:  0.5 mL    Refill:  0    This patient was seen by Drema Dallas, PA-C in collaboration with Dr. Clayborn Bigness as a part of collaborative care agreement.  Total time spent:35 Minutes  Time spent includes review of chart, medications, test results, and follow up plan with the patient.     Lavera Guise, MD  Internal Medicine

## 2022-01-24 NOTE — Telephone Encounter (Signed)
Gastroenterology Pre-Procedure Review  Request Date: 02/25/22 Requesting Physician: Dr. Tobi Bastos  PATIENT REVIEW QUESTIONS: The patient responded to the following health history questions as indicated:    1. Are you having any GI issues?  Gassy sometimes diarrhea thinks its related to metform in 2. Do you have a personal history of Polyps? no 3. Do you have a family history of Colon Cancer or Polyps? no 4. Diabetes Mellitus? yes (type 2 has been advised to hold 2 days prior to colonoscopy) 5. Joint replacements in the past 12 months?no 6. Major health problems in the past 3 months?no 7. Any artificial heart valves, MVP, or defibrillator?no    MEDICATIONS & ALLERGIES:    Patient reports the following regarding taking any anticoagulation/antiplatelet therapy:   Plavix, Coumadin, Eliquis, Xarelto, Lovenox, Pradaxa, Brilinta, or Effient? no Aspirin? no  Patient confirms/reports the following medications:  Current Outpatient Medications  Medication Sig Dispense Refill   Ascorbic Acid (VITAMIN C) 1000 MG tablet Take 1,000 mg by mouth daily.     aspirin 81 MG chewable tablet Chew 1 tablet (81 mg total) by mouth daily. 30 tablet 0   cholecalciferol (VITAMIN D3) 25 MCG (1000 UT) tablet Take 1,000 Units by mouth daily.     Cyanocobalamin (CVS B12 GUMMIES PO) Take by mouth.     doxycycline (VIBRA-TABS) 100 MG tablet Take 1 tablet (100 mg total) by mouth 2 (two) times daily. 14 tablet 0   Flaxseed, Linseed, (GNP FLAX SEED OIL) 1000 MG CAPS Take by mouth.     Magnesium 100 MG TABS Take by mouth.     metFORMIN (GLUCOPHAGE) 1000 MG tablet Take 1 tablet (1,000 mg total) by mouth 2 (two) times daily with a meal. 180 tablet 1   Zoster Vaccine Adjuvanted [facility]) injection Inject 0.5 mLs into the muscle once for 1 dose. 0.5 mL 0   No current facility-administered medications for this visit.    Patient confirms/reports the following allergies:  Allergies  Allergen Reactions   Penicillins Hives     Has patient had a PCN reaction causing immediate rash, facial/tongue/throat swelling, SOB or lightheadedness with hypotension: Yes Has patient had a PCN reaction causing severe rash involving mucus membranes or skin necrosis: No Has patient had a PCN reaction that required hospitalization: No Has patient had a PCN reaction occurring within the last 10 years: Yes If all of the above answers are "NO", then may proceed with Cephalosporin use.    No orders of the defined types were placed in this encounter.   AUTHORIZATION INFORMATION Primary Insurance: 1D#: Group #:  Secondary Insurance: 1D#: Group #:  SCHEDULE INFORMATION: Date: 02/25/22 Time: Location: [facility]

## 2022-01-24 NOTE — Progress Notes (Signed)
69 

## 2022-01-25 LAB — UA/M W/RFLX CULTURE, ROUTINE
Bilirubin, UA: NEGATIVE
Glucose, UA: NEGATIVE
Ketones, UA: NEGATIVE
Leukocytes,UA: NEGATIVE
Nitrite, UA: NEGATIVE
Protein,UA: NEGATIVE
RBC, UA: NEGATIVE
Specific Gravity, UA: >=1.03 — AB (ref 1.005–1.030)
Urobilinogen, Ur: 0.2 mg/dL (ref 0.2–1.0)
pH, UA: 5.5 (ref 5.0–7.5)

## 2022-01-25 LAB — MICROSCOPIC EXAMINATION
Casts: NONE SEEN /lpf
WBC, UA: NONE SEEN /hpf (ref 0–5)

## 2022-01-25 LAB — MICROALBUMIN, URINE: Microalbumin, Urine: <3 ug/mL

## 2022-01-28 DIAGNOSIS — R946 Abnormal results of thyroid function studies: Secondary | ICD-10-CM | POA: Diagnosis not present

## 2022-01-28 DIAGNOSIS — E782 Mixed hyperlipidemia: Secondary | ICD-10-CM | POA: Diagnosis not present

## 2022-01-28 DIAGNOSIS — E538 Deficiency of other specified B group vitamins: Secondary | ICD-10-CM | POA: Diagnosis not present

## 2022-01-28 DIAGNOSIS — R5383 Other fatigue: Secondary | ICD-10-CM | POA: Diagnosis not present

## 2022-01-28 DIAGNOSIS — E559 Vitamin D deficiency, unspecified: Secondary | ICD-10-CM | POA: Diagnosis not present

## 2022-02-01 LAB — TSH+FREE T4
Free T4: 1.16 ng/dL (ref 0.82–1.77)
TSH: 1.75 u[IU]/mL (ref 0.450–4.500)

## 2022-02-01 LAB — IRON,TIBC AND FERRITIN PANEL
Ferritin: 20 ng/mL (ref 15–150)
Iron Saturation: 18 % (ref 15–55)
Iron: 67 ug/dL (ref 27–159)
Total Iron Binding Capacity: 372 ug/dL (ref 250–450)
UIBC: 305 ug/dL (ref 131–425)

## 2022-02-01 LAB — CBC WITH DIFFERENTIAL/PLATELET
Basophils Absolute: 0 10*3/uL (ref 0.0–0.2)
Basos: 1 %
EOS (ABSOLUTE): 0.1 10*3/uL (ref 0.0–0.4)
Eos: 1 %
Hematocrit: 39.6 % (ref 34.0–46.6)
Hemoglobin: 12.4 g/dL (ref 11.1–15.9)
Immature Grans (Abs): 0.1 10*3/uL (ref 0.0–0.1)
Immature Granulocytes: 1 %
Lymphocytes Absolute: 3.2 10*3/uL — ABNORMAL HIGH (ref 0.7–3.1)
Lymphs: 46 %
MCH: 26.6 pg (ref 26.6–33.0)
MCHC: 31.3 g/dL — ABNORMAL LOW (ref 31.5–35.7)
MCV: 85 fL (ref 79–97)
Monocytes Absolute: 0.5 10*3/uL (ref 0.1–0.9)
Monocytes: 7 %
Neutrophils Absolute: 3.1 10*3/uL (ref 1.4–7.0)
Neutrophils: 44 %
Platelets: 224 10*3/uL (ref 150–450)
RBC: 4.67 x10E6/uL (ref 3.77–5.28)
RDW: 12.6 % (ref 11.7–15.4)
WBC: 7 10*3/uL (ref 3.4–10.8)

## 2022-02-01 LAB — LIPID PANEL WITH LDL/HDL RATIO
Cholesterol, Total: 163 mg/dL (ref 100–199)
HDL: 66 mg/dL (ref >39)
LDL Chol Calc (NIH): 83 mg/dL (ref 0–99)
LDL/HDL Ratio: 1.3 ratio (ref 0.0–3.2)
Triglycerides: 70 mg/dL (ref 0–149)
VLDL Cholesterol Cal: 14 mg/dL (ref 5–40)

## 2022-02-01 LAB — COMPREHENSIVE METABOLIC PANEL
ALT: 9 IU/L (ref 0–32)
AST: 16 IU/L (ref 0–40)
Albumin/Globulin Ratio: 1.4 (ref 1.2–2.2)
Albumin: 4 g/dL (ref 3.8–4.9)
Alkaline Phosphatase: 72 IU/L (ref 44–121)
BUN/Creatinine Ratio: 13 (ref 9–23)
BUN: 10 mg/dL (ref 6–24)
Bilirubin Total: 0.4 mg/dL (ref 0.0–1.2)
CO2: 23 mmol/L (ref 20–29)
Calcium: 8.8 mg/dL (ref 8.7–10.2)
Chloride: 105 mmol/L (ref 96–106)
Creatinine, Ser: 0.8 mg/dL (ref 0.57–1.00)
Globulin, Total: 2.9 g/dL (ref 1.5–4.5)
Glucose: 140 mg/dL — ABNORMAL HIGH (ref 70–99)
Potassium: 4.7 mmol/L (ref 3.5–5.2)
Sodium: 139 mmol/L (ref 134–144)
Total Protein: 6.9 g/dL (ref 6.0–8.5)
eGFR: 89 mL/min/{1.73_m2} (ref >59)

## 2022-02-01 LAB — B12 AND FOLATE PANEL
Folate: 14.2 ng/mL (ref >3.0)
Vitamin B-12: >2000 pg/mL — ABNORMAL HIGH (ref 232–1245)

## 2022-02-01 LAB — VITAMIN D 25 HYDROXY (VIT D DEFICIENCY, FRACTURES): Vit D, 25-Hydroxy: 38.2 ng/mL (ref 30.0–100.0)

## 2022-02-01 LAB — MICROALBUMIN, URINE: Microalbumin, Urine: 23.9 ug/mL

## 2022-02-05 ENCOUNTER — Telehealth: Payer: Self-pay

## 2022-02-05 NOTE — Telephone Encounter (Signed)
Spoke with patient regarding lab results. 

## 2022-02-05 NOTE — Telephone Encounter (Signed)
-----   Message from Carlean Jews, PA-C sent at 02/04/2022 10:25 AM EDT ----- Pleases let her know that overall her labs look good. Her b12 is elevated and can reduce supplementation. Her vitamin D and iron are normal but on the lower end and may want to supplement OTC a few days per week

## 2022-02-12 DIAGNOSIS — E119 Type 2 diabetes mellitus without complications: Secondary | ICD-10-CM | POA: Diagnosis not present

## 2022-02-19 ENCOUNTER — Telehealth: Payer: Self-pay | Admitting: Gastroenterology

## 2022-02-19 NOTE — Telephone Encounter (Signed)
Pt called to cancel colonoscopy and will call back to resched

## 2022-02-25 ENCOUNTER — Ambulatory Visit: Admit: 2022-02-25 | Payer: BC Managed Care – PPO | Admitting: Gastroenterology

## 2022-02-25 SURGERY — COLONOSCOPY WITH PROPOFOL
Anesthesia: General

## 2022-03-05 ENCOUNTER — Ambulatory Visit
Admission: RE | Admit: 2022-03-05 | Discharge: 2022-03-05 | Disposition: A | Discharge status: Home / Self Care | Payer: 59 | Source: Ambulatory Visit | Attending: Physician Assistant | Admitting: Physician Assistant

## 2022-03-05 DIAGNOSIS — Z1231 Encounter for screening mammogram for malignant neoplasm of breast: Secondary | ICD-10-CM | POA: Diagnosis not present

## 2022-03-12 NOTE — Telephone Encounter (Signed)
ERROR

## 2022-04-17 ENCOUNTER — Other Ambulatory Visit: Payer: Self-pay | Admitting: Nurse Practitioner

## 2022-04-17 DIAGNOSIS — E1165 Type 2 diabetes mellitus with hyperglycemia: Secondary | ICD-10-CM

## 2022-04-21 ENCOUNTER — Ambulatory Visit: Payer: 59 | Admitting: Physician Assistant

## 2022-05-29 ENCOUNTER — Ambulatory Visit: Payer: 59 | Admitting: Physician Assistant

## 2022-06-05 ENCOUNTER — Encounter: Payer: Self-pay | Admitting: Physician Assistant

## 2022-06-05 ENCOUNTER — Ambulatory Visit (INDEPENDENT_AMBULATORY_CARE_PROVIDER_SITE_OTHER): Payer: 59 | Admitting: Physician Assistant

## 2022-06-05 VITALS — BP 110/71 | HR 84 | Temp 97.8°F | Resp 16 | Ht 61.0 in | Wt 133.4 lb

## 2022-06-05 DIAGNOSIS — E1165 Type 2 diabetes mellitus with hyperglycemia: Secondary | ICD-10-CM | POA: Diagnosis not present

## 2022-06-05 LAB — POCT GLYCOSYLATED HEMOGLOBIN (HGB A1C): Hemoglobin A1C: 7.1 % — AB (ref 4.0–5.6)

## 2022-06-05 MED ORDER — METFORMIN HCL 1000 MG PO TABS: 1000.0000 mg | ORAL_TABLET | Freq: Two times a day (BID) | ORAL | 1 refills | Status: DC

## 2022-06-05 MED ORDER — GLUCOSE BLOOD VI STRP: ORAL_STRIP | 12 refills | Status: AC

## 2022-06-05 MED ORDER — SHINGRIX 50 MCG/0.5ML IM SUSR: 0.5000 mL | Freq: Once | INTRAMUSCULAR | 0 refills | Status: AC

## 2022-06-05 NOTE — Progress Notes (Signed)
[facility] [place], [place] [address]  Internal MEDICINE  Office Visit Note  Patient Name: Stacie Flores  [id number]  [id number]  Date of Service: 06/06/2022  Chief Complaint  Patient presents with   Follow-up   Diabetes   Quality Metric Gaps    TDAP, Flu, TDAP, Colonoscopy    HPI Pt is here for routine follow up -hasn't been able to get strips to read in meter so hasn't been able to check. She is going to check with pharmacy and see if strips covered and if so if new meter needed -taking 1000mg  metformin BID -trying to stay hydrated -walking 3 miles per day usually at work, not exercising outside of this and is going to work on this -avoiding sugary drinks and foods, not having a lot of sugar cravings -has to reschedule colonoscopy when she has a ride -unsure when tdap done  Current Medication: Outpatient Encounter Medications as of 06/05/2022  Medication Sig   Ascorbic Acid (VITAMIN C) 1000 MG tablet Take 1,000 mg by mouth daily.   cholecalciferol (VITAMIN D3) 25 MCG (1000 UT) tablet Take 1,000 Units by mouth daily.   Cyanocobalamin (CVS B12 GUMMIES PO) Take by mouth.   Flaxseed, Linseed, (GNP FLAX SEED OIL) 1000 MG CAPS Take by mouth.   glucose blood test strip Use as instructed   Magnesium 100 MG TABS Take by mouth.   [DISCONTINUED] doxycycline (VIBRA-TABS) 100 MG tablet Take 1 tablet (100 mg total) by mouth 2 (two) times daily.   [DISCONTINUED] metFORMIN (GLUCOPHAGE) 1000 MG tablet TAKE 1 TABLET BY MOUTH 2 TIMES DAILY WITH A MEAL   [DISCONTINUED] Zoster Vaccine Adjuvanted [facility]) injection Inject 0.5 mLs into the muscle once.   metFORMIN (GLUCOPHAGE) 1000 MG tablet Take 1 tablet (1,000 mg total) by mouth 2 (two) times daily with a meal.   [EXPIRED] Zoster Vaccine Adjuvanted [facility]) injection Inject 0.5 mLs into the muscle once for 1 dose.   No facility-administered encounter medications on file as of 06/05/2022.    Surgical History: Past  Surgical History:  Procedure Laterality Date   CESAREAN SECTION     LEEP      Medical History: Past Medical History:  Diagnosis Date   Asthma    Diabetes mellitus without complication ([place])    Stroke ([place])     Family History: Family History  Problem Relation Age of Onset   Breast cancer Sister        early 71's    Social History   Socioeconomic History   Marital status: Single    Spouse name: Not on file   Number of children: Not on file   Years of education: Not on file   Highest education level: Not on file  Occupational History   Not on file  Tobacco Use   Smoking status: Never   Smokeless tobacco: Never  Substance and Sexual Activity   Alcohol use: No   Drug use: No   Sexual activity: Yes  Other Topics Concern   Not on file  Social History Narrative   Live with family   Social Determinants of Health   Financial Resource Strain: Low Risk  (04/09/2018)   Overall Financial Resource Strain (CARDIA)    Difficulty of Paying Living Expenses: Not hard at all  Food Insecurity: No Food Insecurity (04/09/2018)   Hunger Vital Sign    Worried About Running Out of Food in the Last Year: Never true    [place] in the  Last Year: Never true  Transportation Needs: No Transportation Needs (04/09/2018)   PRAPARE - Hydrologist (Medical): No    Lack of Transportation (Non-Medical): No  Physical Activity: Insufficiently Active (04/09/2018)   Exercise Vital Sign    Days of Exercise per Week: 5 days    Minutes of Exercise per Session: 20 min  Stress: No Stress Concern Present (04/09/2018)   [place]    Feeling of Stress : Not at all  Social Connections: Somewhat Isolated (04/09/2018)   Social Connection and Isolation Panel [NHANES]    Frequency of Communication with Friends and Family: Twice a week    Frequency of Social Gatherings with Friends and Family: Twice a week     Attends Religious Services: 1 to 4 times per year    Active Member of [employer] or Organizations: No    Attends Archivist Meetings: Never    Marital Status: Divorced  Human resources officer Violence: Not At Risk (04/09/2018)   Humiliation, Afraid, Rape, and Kick questionnaire    Fear of Current or Ex-Partner: No    Emotionally Abused: No    Physically Abused: No    Sexually Abused: No      Review of Systems  Constitutional:  Negative for chills, fatigue and unexpected weight change.  HENT:  Negative for congestion, postnasal drip, rhinorrhea, sneezing and sore throat.   Eyes:  Negative for redness.  Respiratory:  Negative for cough, chest tightness and shortness of breath.   Cardiovascular:  Negative for chest pain and palpitations.  Gastrointestinal:  Negative for abdominal pain, constipation, diarrhea, nausea and vomiting.  Genitourinary:  Negative for dysuria and frequency.  Musculoskeletal:  Negative for arthralgias, back pain, joint swelling and neck pain.  Skin:  Negative for rash.  Neurological: Negative.  Negative for tremors and numbness.  Hematological:  Negative for adenopathy. Does not bruise/bleed easily.  Psychiatric/Behavioral:  Negative for behavioral problems (Depression), sleep disturbance and suicidal ideas. The patient is not nervous/anxious.     Vital Signs: BP 110/71   Pulse 84   Temp 97.8 F (36.6 C)   Resp 16   Ht 5\' 1"  (1.549 m)   Wt 133 lb 6.4 oz (60.5 kg)   SpO2 98%   BMI 25.21 kg/m    Physical Exam Vitals and nursing note reviewed.  Constitutional:      General: She is not in acute distress.    Appearance: She is well-developed and normal weight. She is not diaphoretic.  HENT:     Head: Normocephalic and atraumatic.     Mouth/Throat:     Pharynx: No oropharyngeal exudate.  Eyes:     Pupils: Pupils are equal, round, and reactive to light.  Neck:     Thyroid: No thyromegaly.     Vascular: No JVD.     Trachea: No tracheal deviation.   Cardiovascular:     Rate and Rhythm: Normal rate and regular rhythm.     Heart sounds: Normal heart sounds. No murmur heard.    No friction rub. No gallop.  Pulmonary:     Effort: Pulmonary effort is normal. No respiratory distress.     Breath sounds: No wheezing or rales.  Chest:     Chest wall: No tenderness.  Abdominal:     General: Bowel sounds are normal.     Palpations: Abdomen is soft.  Musculoskeletal:        General: Normal range of motion.  Cervical back: Normal range of motion and neck supple.  Lymphadenopathy:     Cervical: No cervical adenopathy.  Skin:    General: Skin is warm and dry.  Neurological:     Mental Status: She is alert and oriented to person, place, and time.     Cranial Nerves: No cranial nerve deficit.  Psychiatric:        Behavior: Behavior normal.        Thought Content: Thought content normal.        Judgment: Judgment normal.        Assessment/Plan: 1. Type 2 diabetes mellitus with hyperglycemia, without long-term current use of insulin (HCC) - POCT HgB A1C is 7.1 which is increased from 6.9 last visit. Will work on fixinf/obtaining new meter to track sugars and start back exercising again. Will continue with current medications for now, however discussed if not improving next visit then will need to adjust meds and consider adding SGLT2 vs glimepiride - Urine Microalbumin w/creat. ratio - glucose blood test strip; Use as instructed  Dispense: 100 each; Refill: 12 - metFORMIN (GLUCOPHAGE) 1000 MG tablet; Take 1 tablet (1,000 mg total) by mouth 2 (two) times daily with a meal.  Dispense: 180 tablet; Refill: 1   General Counseling: Teana verbalizes understanding of the findings of todays visit and agrees with plan of treatment. I have discussed any further diagnostic evaluation that may be needed or ordered today. We also reviewed her medications today. she has been encouraged to call the office with any questions or concerns that should  arise related to todays visit.    Orders Placed This Encounter  Procedures   Urine Microalbumin w/creat. ratio   POCT HgB A1C    Meds ordered this encounter  Medications   glucose blood test strip    Sig: Use as instructed    Dispense:  100 each    Refill:  12   Zoster Vaccine Adjuvanted [facility]) injection    Sig: Inject 0.5 mLs into the muscle once for 1 dose.    Dispense:  0.5 mL    Refill:  0   metFORMIN (GLUCOPHAGE) 1000 MG tablet    Sig: Take 1 tablet (1,000 mg total) by mouth 2 (two) times daily with a meal.    Dispense:  180 tablet    Refill:  1    FOR NEXT FILL    This patient was seen by Drema Dallas, PA-C in collaboration with Dr. Clayborn Bigness as a part of collaborative care agreement.   Total time spent:30 Minutes Time spent includes review of chart, medications, test results, and follow up plan with the patient.      Dr Lavera Guise Internal medicine

## 2022-07-03 DIAGNOSIS — Z419 Encounter for procedure for purposes other than remedying health state, unspecified: Secondary | ICD-10-CM | POA: Diagnosis not present

## 2022-07-28 DIAGNOSIS — Z823 Family history of stroke: Secondary | ICD-10-CM | POA: Diagnosis not present

## 2022-07-28 DIAGNOSIS — Z8673 Personal history of transient ischemic attack (TIA), and cerebral infarction without residual deficits: Secondary | ICD-10-CM | POA: Diagnosis not present

## 2022-07-28 DIAGNOSIS — Z811 Family history of alcohol abuse and dependence: Secondary | ICD-10-CM | POA: Diagnosis not present

## 2022-07-28 DIAGNOSIS — Z8249 Family history of ischemic heart disease and other diseases of the circulatory system: Secondary | ICD-10-CM | POA: Diagnosis not present

## 2022-07-28 DIAGNOSIS — Z7984 Long term (current) use of oral hypoglycemic drugs: Secondary | ICD-10-CM | POA: Diagnosis not present

## 2022-07-28 DIAGNOSIS — R030 Elevated blood-pressure reading, without diagnosis of hypertension: Secondary | ICD-10-CM | POA: Diagnosis not present

## 2022-07-28 DIAGNOSIS — Z880 Allergy status to penicillin: Secondary | ICD-10-CM | POA: Diagnosis not present

## 2022-07-28 DIAGNOSIS — Z809 Family history of malignant neoplasm, unspecified: Secondary | ICD-10-CM | POA: Diagnosis not present

## 2022-07-28 DIAGNOSIS — Z833 Family history of diabetes mellitus: Secondary | ICD-10-CM | POA: Diagnosis not present

## 2022-07-28 DIAGNOSIS — E119 Type 2 diabetes mellitus without complications: Secondary | ICD-10-CM | POA: Diagnosis not present

## 2022-08-01 DIAGNOSIS — Z419 Encounter for procedure for purposes other than remedying health state, unspecified: Secondary | ICD-10-CM | POA: Diagnosis not present

## 2022-09-01 DIAGNOSIS — Z419 Encounter for procedure for purposes other than remedying health state, unspecified: Secondary | ICD-10-CM | POA: Diagnosis not present

## 2022-09-08 ENCOUNTER — Ambulatory Visit: Payer: 59 | Admitting: Physician Assistant

## 2022-09-15 ENCOUNTER — Ambulatory Visit (INDEPENDENT_AMBULATORY_CARE_PROVIDER_SITE_OTHER): Payer: 59 | Admitting: Physician Assistant

## 2022-09-15 ENCOUNTER — Encounter: Payer: Self-pay | Admitting: Physician Assistant

## 2022-09-15 VITALS — BP 128/84 | HR 77 | Temp 97.8°F | Resp 16 | Ht 61.0 in | Wt 133.4 lb

## 2022-09-15 DIAGNOSIS — Z1212 Encounter for screening for malignant neoplasm of rectum: Secondary | ICD-10-CM

## 2022-09-15 DIAGNOSIS — Z1211 Encounter for screening for malignant neoplasm of colon: Secondary | ICD-10-CM | POA: Diagnosis not present

## 2022-09-15 DIAGNOSIS — E1165 Type 2 diabetes mellitus with hyperglycemia: Secondary | ICD-10-CM | POA: Diagnosis not present

## 2022-09-15 LAB — POCT GLYCOSYLATED HEMOGLOBIN (HGB A1C): Hemoglobin A1C: 7.1 % — AB (ref 4.0–5.6)

## 2022-09-15 NOTE — Progress Notes (Signed)
[facility] [address] [place], [place] [address]  Internal MEDICINE  Office Visit Note  Patient Name: Stacie Flores  [id number]  [id number]  Date of Service: 09/15/2022  Chief Complaint  Patient presents with   Follow-up   Diabetes   Quality Metric Gaps    Colonoscopy and TDAP    HPI Pt is here for routine follow up -BG in Am is 118-120, walking more and drinking more water, but also started juicing and discussed this can be high in sugar. Has also been doing pancakes for breakfast many days. -Will give diabetic meal guide to help  -Working 2 jobs now, still at [employer] and now also at [employer] -Did have second shingles vaccine and will call with date, still needs to get tdap -Does not have transportation for colonoscopy and is unsure when this can be arranged. Will go ahead with cologuard for now since no symptoms or Fhx of colon cancer  Current Medication: Outpatient Encounter Medications as of 09/15/2022  Medication Sig   Ascorbic Acid (VITAMIN C) 1000 MG tablet Take 1,000 mg by mouth daily.   cholecalciferol (VITAMIN D3) 25 MCG (1000 UT) tablet Take 1,000 Units by mouth daily.   Cyanocobalamin (CVS B12 GUMMIES PO) Take by mouth.   Flaxseed, Linseed, (GNP FLAX SEED OIL) 1000 MG CAPS Take by mouth.   glucose blood test strip Use as instructed   Magnesium 100 MG TABS Take by mouth.   metFORMIN (GLUCOPHAGE) 1000 MG tablet Take 1 tablet (1,000 mg total) by mouth 2 (two) times daily with a meal.   No facility-administered encounter medications on file as of 09/15/2022.    Surgical History: Past Surgical History:  Procedure Laterality Date   CESAREAN SECTION     LEEP      Medical History: Past Medical History:  Diagnosis Date   Asthma    Diabetes mellitus without complication    Stroke     Family History: Family History  Problem Relation Age of Onset   Breast cancer Sister        early 92's    Social History   Socioeconomic History   Marital status:  Single    Spouse name: Not on file   Number of children: Not on file   Years of education: Not on file   Highest education level: Not on file  Occupational History   Not on file  Tobacco Use   Smoking status: Never   Smokeless tobacco: Never  Substance and Sexual Activity   Alcohol use: No   Drug use: No   Sexual activity: Yes  Other Topics Concern   Not on file  Social History Narrative   Live with family   Social Determinants of Health   Financial Resource Strain: Low Risk  (04/09/2018)   Overall Financial Resource Strain (CARDIA)    Difficulty of Paying Living Expenses: Not hard at all  Food Insecurity: No Food Insecurity (04/09/2018)   Hunger Vital Sign    Worried About Running Out of Food in the Last Year: Never true    Ran Out of Food in the Last Year: Never true  Transportation Needs: No Transportation Needs (04/09/2018)   PRAPARE - Administrator, Civil Service (Medical): No    Lack of Transportation (Non-Medical): No  Physical Activity: Insufficiently Active (04/09/2018)   Exercise Vital Sign    Days of Exercise per Week: 5 days    Minutes of Exercise per Session: 20 min  Stress: No Stress  Concern Present (04/09/2018)   [employer] of Occupational Health - Occupational Stress Questionnaire    Feeling of Stress : Not at all  Social Connections: Somewhat Isolated (04/09/2018)   Social Connection and Isolation Panel [NHANES]    Frequency of Communication with Friends and Family: Twice a week    Frequency of Social Gatherings with Friends and Family: Twice a week    Attends Religious Services: 1 to 4 times per year    Active Member of [employer] or Organizations: No    Attends Banker Meetings: Never    Marital Status: Divorced  Catering manager Violence: Not At Risk (04/09/2018)   Humiliation, Afraid, Rape, and Kick questionnaire    Fear of Current or Ex-Partner: No    Emotionally Abused: No    Physically Abused: No    Sexually Abused: No       Review of Systems  Constitutional:  Negative for chills, fatigue and unexpected weight change.  HENT:  Negative for congestion, postnasal drip, rhinorrhea, sneezing and sore throat.   Eyes:  Negative for redness.  Respiratory:  Negative for cough, chest tightness and shortness of breath.   Cardiovascular:  Negative for chest pain and palpitations.  Gastrointestinal:  Negative for abdominal pain, constipation, diarrhea, nausea and vomiting.  Genitourinary:  Negative for dysuria and frequency.  Musculoskeletal:  Negative for arthralgias, back pain, joint swelling and neck pain.  Skin:  Negative for rash.  Neurological: Negative.  Negative for tremors and numbness.  Hematological:  Negative for adenopathy. Does not bruise/bleed easily.  Psychiatric/Behavioral:  Negative for behavioral problems (Depression), sleep disturbance and suicidal ideas. The patient is not nervous/anxious.     Vital Signs: BP 128/84   Pulse 77   Temp 97.8 F (36.6 C)   Resp 16   Ht  (1.549 m)   Wt 133 lb 6.4 oz (60.5 kg)   SpO2 96%   BMI 25.21 kg/m    Physical Exam Vitals and nursing note reviewed.  Constitutional:      General: She is not in acute distress.    Appearance: She is well-developed and normal weight. She is not diaphoretic.  HENT:     Head: Normocephalic and atraumatic.     Mouth/Throat:     Pharynx: No oropharyngeal exudate.  Eyes:     Pupils: Pupils are equal, round, and reactive to light.  Neck:     Thyroid: No thyromegaly.     Vascular: No JVD.     Trachea: No tracheal deviation.  Cardiovascular:     Rate and Rhythm: Normal rate and regular rhythm.     Heart sounds: Normal heart sounds. No murmur heard.    No friction rub. No gallop.  Pulmonary:     Effort: Pulmonary effort is normal. No respiratory distress.     Breath sounds: No wheezing or rales.  Chest:     Chest wall: No tenderness.  Abdominal:     General: Bowel sounds are normal.     Palpations: Abdomen  is soft.  Musculoskeletal:        General: Normal range of motion.     Cervical back: Normal range of motion and neck supple.  Lymphadenopathy:     Cervical: No cervical adenopathy.  Skin:    General: Skin is warm and dry.  Neurological:     Mental Status: She is alert and oriented to person, place, and time.     Cranial Nerves: No cranial nerve deficit.  Psychiatric:  Behavior: Behavior normal.        Thought Content: Thought content normal.        Judgment: Judgment normal.        Assessment/Plan: 1. Type 2 diabetes mellitus with hyperglycemia, without long-term current use of insulin - POCT HgB A1C is 7.1 which is stable from last visit. Will follow diabetic meal guide and really work on this. If not improving again then will add medication  2. Screening for colorectal cancer - Cologuard   General Counseling: Jackye verbalizes understanding of the findings of todays visit and agrees with plan of treatment. I have discussed any further diagnostic evaluation that may be needed or ordered today. We also reviewed her medications today. she has been encouraged to call the office with any questions or concerns that should arise related to todays visit.    Orders Placed This Encounter  Procedures   Cologuard   POCT HgB A1C    No orders of the defined types were placed in this encounter.   This patient was seen by Lynn Ito, PA-C in collaboration with Dr. Beverely Risen as a part of collaborative care agreement.   Total time spent:30 Minutes Time spent includes review of chart, medications, test results, and follow up plan with the patient.      Dr Lyndon Code Internal medicine

## 2022-10-01 DIAGNOSIS — Z419 Encounter for procedure for purposes other than remedying health state, unspecified: Secondary | ICD-10-CM | POA: Diagnosis not present

## 2022-11-01 DIAGNOSIS — Z419 Encounter for procedure for purposes other than remedying health state, unspecified: Secondary | ICD-10-CM | POA: Diagnosis not present

## 2022-12-01 DIAGNOSIS — Z419 Encounter for procedure for purposes other than remedying health state, unspecified: Secondary | ICD-10-CM | POA: Diagnosis not present

## 2022-12-08 ENCOUNTER — Ambulatory Visit (INDEPENDENT_AMBULATORY_CARE_PROVIDER_SITE_OTHER): Payer: Self-pay | Admitting: Physician Assistant

## 2022-12-08 VITALS — BP 110/68 | HR 64 | Temp 97.2°F | Resp 16 | Wt 127.0 lb

## 2022-12-08 DIAGNOSIS — J069 Acute upper respiratory infection, unspecified: Secondary | ICD-10-CM

## 2022-12-08 DIAGNOSIS — R197 Diarrhea, unspecified: Secondary | ICD-10-CM

## 2022-12-08 LAB — POC SOFIA 2 FLU + SARS ANTIGEN FIA
Influenza A, POC: NEGATIVE
Influenza B, POC: NEGATIVE
SARS Coronavirus 2 Ag: NEGATIVE

## 2022-12-08 NOTE — Progress Notes (Signed)
[facility] [address] [place], [place] [address]   Office Visit Note  Patient Name: Stacie Flores Date of Birth [id number]  Medical Record number [id number]  Date of Service: 12/08/2022  Chief Complaint  Patient presents with   URI     52 y/o F presents to the clinic for c/o feeling tired/fatigued, scratchy throat, cough, and diarrhea x 2 days. Denies CP, SOB, wheezing, fever or chills. She did take Tylenol earlier today at 6 am. She took Mucinex to help with cough symptoms. No known exposure to strep, flu, or covid. Denies unusual diet. No recent international travels.   URI  Associated symptoms include congestion and a sore throat. Pertinent negatives include no headaches or sinus pain.      Current Medication:  Outpatient Encounter Medications as of 12/08/2022  Medication Sig   glucose blood test strip Use as instructed   metFORMIN (GLUCOPHAGE) 1000 MG tablet Take 1 tablet (1,000 mg total) by mouth 2 (two) times daily with a meal.   Ascorbic Acid (VITAMIN C) 1000 MG tablet Take 1,000 mg by mouth daily.   cholecalciferol (VITAMIN D3) 25 MCG (1000 UT) tablet Take 1,000 Units by mouth daily.   Cyanocobalamin (CVS B12 GUMMIES PO) Take by mouth.   Flaxseed, Linseed, (GNP FLAX SEED OIL) 1000 MG CAPS Take by mouth.   Magnesium 100 MG TABS Take by mouth.   No facility-administered encounter medications on file as of 12/08/2022.      Medical History: Past Medical History:  Diagnosis Date   Asthma    Diabetes mellitus without complication (HCC)    Stroke (HCC)      Vital Signs: BP 110/68 (BP Location: Left Arm, Patient Position: Sitting, Cuff Size: Normal)   Pulse 64   Temp (!) 97.2 F (36.2 C) (Tympanic)   Resp 16   Wt 127 lb (57.6 kg)   SpO2 97%   BMI 24.00 kg/m    Review of Systems  Constitutional: Negative.   HENT:  Positive for congestion, sinus pressure and sore throat. Negative for ear discharge, postnasal drip, sinus pain and trouble swallowing.    Respiratory: Negative.    Cardiovascular: Negative.   Neurological:  Positive for dizziness. Negative for weakness, light-headedness, numbness and headaches.    Physical Exam Constitutional:      Appearance: Normal appearance.  HENT:     Head: Atraumatic.     Right Ear: Tympanic membrane, ear canal and external ear normal.     Left Ear: Tympanic membrane, ear canal and external ear normal.     Nose: Nose normal.     Right Turbinates: Swollen.     Left Turbinates: Enlarged and swollen.     Mouth/Throat:     Mouth: Mucous membranes are moist.     Pharynx: Oropharynx is clear.  Eyes:     Extraocular Movements: Extraocular movements intact.  Cardiovascular:     Rate and Rhythm: Normal rate and regular rhythm.  Pulmonary:     Effort: Pulmonary effort is normal.     Breath sounds: Normal breath sounds.  Musculoskeletal:     Cervical back: Neck supple.  Skin:    General: Skin is warm.  Neurological:     Mental Status: She is alert.  Psychiatric:        Mood and Affect: Mood normal.        Behavior: Behavior normal.        Thought Content: Thought content normal.  Judgment: Judgment normal.       Assessment/Plan:  1. Viral upper respiratory tract infection - POC SOFIA 2 FLU + SARS ANTIGEN FIA  Reviewed negative flu and covid test results with patient. She verbalized understanding. Take otc Imodium for diarrhea. REST Rehydrate with clear liquids Pt requested a work excuse note. Pt was give a work note to stay home for the next two days. RTW on 07/10 if she is feeling better. Pt verbalized understanding and in agreement.    General Counseling: Sheyann verbalizes understanding of the findings of todays visit and agrees with plan of treatment. I have discussed any further diagnostic evaluation that may be needed or ordered today. We also reviewed her medications today. she has been encouraged to call the office with any questions or concerns that should arise related  to todays visit.    Time spent:25 Minutes    Gilberto Better, [place] Physician Assistant

## 2022-12-11 ENCOUNTER — Ambulatory Visit (INDEPENDENT_AMBULATORY_CARE_PROVIDER_SITE_OTHER): Payer: Self-pay | Admitting: Adult Health

## 2022-12-11 DIAGNOSIS — J019 Acute sinusitis, unspecified: Secondary | ICD-10-CM

## 2022-12-11 MED ORDER — DOXYCYCLINE HYCLATE 100 MG PO TABS: 100.0000 mg | ORAL_TABLET | Freq: Two times a day (BID) | ORAL | 0 refills | Status: DC

## 2022-12-11 NOTE — Progress Notes (Signed)
Virtual Visit Consent   Stacie Flores, you are scheduled for a virtual visit with a [facility] provider today. Just as with appointments in the office, your consent must be obtained to participate.   I need to obtain your verbal consent now. Are you willing to proceed with your visit today? Stacie Flores has provided verbal consent on 12/11/2022 for a virtual visit (video or telephone). Johnna Acosta, NP  Date: 12/11/2022 7:35 AM  Virtual Visit via Video Note   I, Johnna Acosta, connected with  Stacie Flores  ([id number], April 28, 1971) on 12/11/22 at  7:30 AM EDT by telephone and verified that I am speaking with the correct person using two identifiers.  Location: Patient: Virtual Visit Location Patient: Home Provider: Virtual Visit Location Provider: Office/Clinic   I discussed the limitations of evaluation and management by telemedicine and the availability of in person appointments. The patient expressed understanding and agreed to proceed.    History of Present Illness: Stacie Flores is a 52 y.o. and is being seen today for follow up.  She was seen 3 days ago.  See previous OV note.  She has been taking Mucinex DM.  That did not help much.  Now she has been taking a multisymptom  allergie medicine. She reports continued weakness, dizziness and diarrhea. Denies fever/chills.    HPI  Problems:  Patient Active Problem List   Diagnosis Date Noted   Other hypersomnia 12/14/2020   Inflammatory polyarthropathy (HCC) 03/07/2020   Flu vaccine need 03/07/2020   IFG (impaired fasting glucose) 02/15/2019   Type 2 diabetes mellitus with hyperglycemia, without long-term current use of insulin (HCC) 02/15/2019   Dysuria 02/15/2019   Stroke (HCC) 04/09/2018    Allergies:  Allergies  Allergen Reactions   Penicillins Hives    Has patient had a PCN reaction causing immediate rash, facial/tongue/throat swelling, SOB or lightheadedness with hypotension: Yes Has patient had a PCN reaction causing  severe rash involving mucus membranes or skin necrosis: No Has patient had a PCN reaction that required hospitalization: No Has patient had a PCN reaction occurring within the last 10 years: Yes If all of the above answers are "NO", then may proceed with Cephalosporin use.   Medications:  Current Outpatient Medications:    Ascorbic Acid (VITAMIN C) 1000 MG tablet, Take 1,000 mg by mouth daily., Disp: , Rfl:    cholecalciferol (VITAMIN D3) 25 MCG (1000 UT) tablet, Take 1,000 Units by mouth daily., Disp: , Rfl:    Cyanocobalamin ([employer] B12 GUMMIES PO), Take by mouth., Disp: , Rfl:    Flaxseed, Linseed, (GNP FLAX SEED OIL) 1000 MG CAPS, Take by mouth., Disp: , Rfl:    glucose blood test strip, Use as instructed, Disp: 100 each, Rfl: 12   Magnesium 100 MG TABS, Take by mouth., Disp: , Rfl:    metFORMIN (GLUCOPHAGE) 1000 MG tablet, Take 1 tablet (1,000 mg total) by mouth 2 (two) times daily with a meal., Disp: 180 tablet, Rfl: 1  Observations/Objective: No labored breathing. +congestion Speech is clear and coherent with logical content.  Patient is alert and oriented at baseline.   Assessment and Plan: There are no diagnoses linked to this encounter. 1. Acute non-recurrent sinusitis, unspecified location Pt instructed to return to clinic on Monday if symptoms do not improve. Stay hydrated, and continue over the counter medications for symptom management. Patient Instructions: -Take complete course of antibiotics as prescribed.  Take with food.  -Try Flonase/Fluticasone nasal spray, 2 sprays  to each nostril once a day. -You can try using a neti pot or nasal saline rinse product to help clear mucus congestion. -Rest and stay well hydrated (by drinking water and other liquids). Avoid/limit caffeine. -Take over-the-counter medicines (i.e. Mucinex, decongestant, Ibuprofen or Tylenol, cough suppressant) to help relieve your symptoms. -For your cough, use cough drops/throat lozenges, gargle warm salt  water and/or drink warm liquids (like tea with honey). -Send my chart message to provider or schedule return visit as needed for new/worsening symptoms or if symptoms do not improve as discussed with antibiotic and other recommended treatment.   - doxycycline (VIBRA-TABS) 100 MG tablet; Take 1 tablet (100 mg total) by mouth 2 (two) times daily.  Dispense: 20 tablet; Refill: 0     Follow Up Instructions: I discussed the assessment and treatment plan with the patient. The patient was provided an opportunity to ask questions and all were answered. The patient agreed with the plan and demonstrated an understanding of the instructions.    The patient was advised to call back or seek an in-person evaluation if the symptoms worsen or if the condition fails to improve as anticipated.  Time:  I spent 10 minutes with the patient via telehealth technology discussing the above problems/concerns.    Johnna Acosta, NP

## 2023-01-01 DIAGNOSIS — Z419 Encounter for procedure for purposes other than remedying health state, unspecified: Secondary | ICD-10-CM | POA: Diagnosis not present

## 2023-01-22 ENCOUNTER — Ambulatory Visit: Payer: 59 | Admitting: Physician Assistant

## 2023-01-30 ENCOUNTER — Encounter: Payer: Self-pay | Admitting: Physician Assistant

## 2023-01-30 ENCOUNTER — Ambulatory Visit (INDEPENDENT_AMBULATORY_CARE_PROVIDER_SITE_OTHER): Payer: 59 | Admitting: Physician Assistant

## 2023-01-30 VITALS — BP 130/70 | HR 68 | Temp 98.4°F | Resp 16 | Ht 61.0 in | Wt 129.0 lb

## 2023-01-30 DIAGNOSIS — E538 Deficiency of other specified B group vitamins: Secondary | ICD-10-CM

## 2023-01-30 DIAGNOSIS — R300 Dysuria: Secondary | ICD-10-CM | POA: Diagnosis not present

## 2023-01-30 DIAGNOSIS — E559 Vitamin D deficiency, unspecified: Secondary | ICD-10-CM

## 2023-01-30 DIAGNOSIS — E1165 Type 2 diabetes mellitus with hyperglycemia: Secondary | ICD-10-CM

## 2023-01-30 DIAGNOSIS — R946 Abnormal results of thyroid function studies: Secondary | ICD-10-CM | POA: Diagnosis not present

## 2023-01-30 DIAGNOSIS — E782 Mixed hyperlipidemia: Secondary | ICD-10-CM

## 2023-01-30 DIAGNOSIS — R5383 Other fatigue: Secondary | ICD-10-CM | POA: Diagnosis not present

## 2023-01-30 DIAGNOSIS — Z1231 Encounter for screening mammogram for malignant neoplasm of breast: Secondary | ICD-10-CM

## 2023-01-30 DIAGNOSIS — Z0001 Encounter for general adult medical examination with abnormal findings: Secondary | ICD-10-CM

## 2023-01-30 LAB — POCT GLYCOSYLATED HEMOGLOBIN (HGB A1C): Hemoglobin A1C: 7.4 % — AB (ref 4.0–5.6)

## 2023-01-30 MED ORDER — DAPAGLIFLOZIN PROPANEDIOL 10 MG PO TABS: 10.0000 mg | ORAL_TABLET | Freq: Every day | ORAL | 2 refills | Status: DC

## 2023-01-30 NOTE — Progress Notes (Signed)
[facility] [address] [place], [place] [address]  Internal MEDICINE  Office Visit Note  Patient Name: Stacie Flores  [id number]  [id number]  Date of Service: 02/10/2023  Chief Complaint  Patient presents with   Annual Exam   Diabetes   Quality Metric Gaps    TDAP and Colonoscopy     HPI Pt is here for routine health maintenance examination -cologuard not done -Due for labs -Thinks she may be perimenopausal -right arm pain in elbow, a little tender, ROM reduced, harder to lift things, no problems with grip. Possible arthritis vs tendonitis. -GI upset, some diarrhea, no diet change. May be metformin. Has been going on for awhile -Will decrease to 1/2 metformin in Am, then may decrease evening as well if diarrhea continues. Will try adding farxiga -eye exam in sept -thinks pap a few years ago. Last in file appears to be 2020  Current Medication: Outpatient Encounter Medications as of 01/30/2023  Medication Sig   Ascorbic Acid (VITAMIN C) 1000 MG tablet Take 1,000 mg by mouth daily.   cholecalciferol (VITAMIN D3) 25 MCG (1000 UT) tablet Take 1,000 Units by mouth daily.   Cyanocobalamin (CVS B12 GUMMIES PO) Take by mouth.   dapagliflozin propanediol (FARXIGA) 10 MG TABS tablet Take 1 tablet (10 mg total) by mouth daily before breakfast. Start with 1/2 tab daily for 1 week then increase to daily.   doxycycline (VIBRA-TABS) 100 MG tablet Take 1 tablet (100 mg total) by mouth 2 (two) times daily.   Flaxseed, Linseed, (GNP FLAX SEED OIL) 1000 MG CAPS Take by mouth.   glucose blood test strip Use as instructed   Magnesium 100 MG TABS Take by mouth.   metFORMIN (GLUCOPHAGE) 1000 MG tablet Take 1 tablet (1,000 mg total) by mouth 2 (two) times daily with a meal.   No facility-administered encounter medications on file as of 01/30/2023.    Surgical History: Past Surgical History:  Procedure Laterality Date   CESAREAN SECTION     LEEP      Medical History: Past  Medical History:  Diagnosis Date   Asthma    Diabetes mellitus without complication (HCC)    Stroke (HCC)     Family History: Family History  Problem Relation Age of Onset   Breast cancer Sister        early 41's      Review of Systems  Constitutional:  Negative for chills, fatigue and unexpected weight change.  HENT:  Negative for congestion, postnasal drip, rhinorrhea, sneezing and sore throat.   Eyes:  Negative for redness.  Respiratory:  Negative for cough, chest tightness and shortness of breath.   Cardiovascular:  Negative for chest pain and palpitations.  Gastrointestinal:  Positive for diarrhea. Negative for abdominal pain, constipation, nausea and vomiting.  Genitourinary:  Negative for dysuria and frequency.  Musculoskeletal:  Positive for arthralgias. Negative for back pain, joint swelling and neck pain.  Skin:  Negative for rash.  Neurological: Negative.  Negative for tremors and numbness.  Hematological:  Negative for adenopathy. Does not bruise/bleed easily.  Psychiatric/Behavioral:  Negative for behavioral problems (Depression), sleep disturbance and suicidal ideas. The patient is not nervous/anxious.      Vital Signs: BP 130/70   Pulse 68   Temp 98.4 F (36.9 C)   Resp 16   Ht 5\' 1"  (1.549 m)   Wt 129 lb (58.5 kg)   SpO2 98%   BMI 24.37 kg/m    Physical Exam Vitals and nursing  note reviewed.  Constitutional:      General: She is not in acute distress.    Appearance: She is well-developed and normal weight. She is not diaphoretic.  HENT:     Head: Normocephalic and atraumatic.     Mouth/Throat:     Pharynx: No oropharyngeal exudate.  Eyes:     Pupils: Pupils are equal, round, and reactive to light.  Neck:     Thyroid: No thyromegaly.     Vascular: No JVD.     Trachea: No tracheal deviation.  Cardiovascular:     Rate and Rhythm: Normal rate and regular rhythm.     Heart sounds: Normal heart sounds. No murmur heard.    No friction rub. No  gallop.  Pulmonary:     Effort: Pulmonary effort is normal. No respiratory distress.     Breath sounds: No wheezing or rales.  Chest:     Chest wall: No tenderness.  Abdominal:     General: Bowel sounds are normal.     Palpations: Abdomen is soft.     Tenderness: There is no abdominal tenderness.  Musculoskeletal:        General: Normal range of motion.     Cervical back: Normal range of motion and neck supple.  Lymphadenopathy:     Cervical: No cervical adenopathy.  Skin:    General: Skin is warm and dry.  Neurological:     Mental Status: She is alert and oriented to person, place, and time.     Cranial Nerves: No cranial nerve deficit.  Psychiatric:        Behavior: Behavior normal.        Thought Content: Thought content normal.        Judgment: Judgment normal.      LABS: Recent Results (from the past 2160 hour(s))  POC SOFIA 2 FLU + SARS ANTIGEN FIA     Status: Normal   Collection Time: 12/08/22 10:30 AM  Result Value Ref Range   Influenza A, POC Negative Negative   Influenza B, POC Negative Negative   SARS Coronavirus 2 Ag Negative Negative  POCT HgB A1C     Status: Abnormal   Collection Time: 01/30/23 10:00 AM  Result Value Ref Range   Hemoglobin A1C 7.4 (A) 4.0 - 5.6 %   HbA1c POC (<> result, manual entry)     HbA1c, POC (prediabetic range)     HbA1c, POC (controlled diabetic range)    UA/M w/rflx Culture, Routine     Status: None   Collection Time: 01/30/23 12:14 PM   Specimen: Urine   Urine  Result Value Ref Range   Specific Gravity, UA 1.014 1.005 - 1.030   pH, UA 5.5 5.0 - 7.5   Color, UA Yellow Yellow   Appearance Ur Clear Clear   Leukocytes,UA Negative Negative   Protein,UA Negative Negative/Trace   Glucose, UA Negative Negative   Ketones, UA Negative Negative   RBC, UA Negative Negative   Bilirubin, UA Negative Negative   Urobilinogen, Ur 0.2 0.2 - 1.0 mg/dL   Nitrite, UA Negative Negative   Microscopic Examination Comment     Comment:  Microscopic follows if indicated.   Microscopic Examination See below:     Comment: Microscopic was indicated and was performed.   Urinalysis Reflex Comment     Comment: This specimen will not reflex to a Urine Culture.  Microscopic Examination     Status: None   Collection Time: 01/30/23 12:14 PM   Urine  Result Value Ref Range   WBC, UA None seen 0 - 5 /hpf   RBC, Urine 0-2 0 - 2 /hpf   Epithelial Cells (non renal) 0-10 0 - 10 /hpf   Casts None seen None seen /lpf   Bacteria, UA Few None seen/Few  CBC w/Diff/Platelet     Status: Abnormal   Collection Time: 02/04/23  8:28 AM  Result Value Ref Range   WBC 7.2 3.4 - 10.8 x10E3/uL   RBC 4.98 3.77 - 5.28 x10E6/uL   Hemoglobin 13.0 11.1 - 15.9 g/dL   Hematocrit 16.1 09.6 - 46.6 %   MCV 83 79 - 97 fL   MCH 26.1 (L) 26.6 - 33.0 pg   MCHC 31.5 31.5 - 35.7 g/dL   RDW 04.5 40.9 - 81.1 %   Platelets 217 150 - 450 x10E3/uL   Neutrophils 46 Not Estab. %   Lymphs 42 Not Estab. %   Monocytes 10 Not Estab. %   Eos 1 Not Estab. %   Basos 1 Not Estab. %   Neutrophils Absolute 3.4 1.4 - 7.0 x10E3/uL   Lymphocytes Absolute 3.0 0.7 - 3.1 x10E3/uL   Monocytes Absolute 0.7 0.1 - 0.9 x10E3/uL   EOS (ABSOLUTE) 0.1 0.0 - 0.4 x10E3/uL   Basophils Absolute 0.0 0.0 - 0.2 x10E3/uL   Immature Granulocytes 0 Not Estab. %   Immature Grans (Abs) 0.0 0.0 - 0.1 x10E3/uL  Comprehensive metabolic panel     Status: Abnormal   Collection Time: 02/04/23  8:28 AM  Result Value Ref Range   Glucose 122 (H) 70 - 99 mg/dL   BUN 7 6 - 24 mg/dL   Creatinine, Ser 9.14 0.57 - 1.00 mg/dL   eGFR 82 >78 GN/FAO/1.30   BUN/Creatinine Ratio 8 (L) 9 - 23   Sodium 139 134 - 144 mmol/L   Potassium 4.1 3.5 - 5.2 mmol/L   Chloride 104 96 - 106 mmol/L   CO2 23 20 - 29 mmol/L   Calcium 9.3 8.7 - 10.2 mg/dL   Total Protein 6.9 6.0 - 8.5 g/dL   Albumin 3.8 3.8 - 4.9 g/dL   Globulin, Total 3.1 1.5 - 4.5 g/dL   Bilirubin Total 0.7 0.0 - 1.2 mg/dL   Alkaline Phosphatase 78 44 -  121 IU/L   AST 13 0 - 40 IU/L   ALT 6 0 - 32 IU/L  TSH + free T4     Status: None   Collection Time: 02/04/23  8:28 AM  Result Value Ref Range   TSH 1.730 0.450 - 4.500 uIU/mL   Free T4 1.02 0.82 - 1.77 ng/dL  Lipid Panel With LDL/HDL Ratio     Status: Abnormal   Collection Time: 02/04/23  8:28 AM  Result Value Ref Range   Cholesterol, Total 204 (H) 100 - 199 mg/dL   Triglycerides 865 0 - 149 mg/dL   HDL 86 >78 mg/dL   VLDL Cholesterol Cal 18 5 - 40 mg/dL   LDL Chol Calc (NIH) 469 (H) 0 - 99 mg/dL   LDL/HDL Ratio 1.2 0.0 - 3.2 ratio    Comment:                                     LDL/HDL Ratio  Men  Women                               1/2 Avg.Risk  1.0    1.5                                   Avg.Risk  3.6    3.2                                2X Avg.Risk  6.2    5.0                                3X Avg.Risk  8.0    6.1   VITAMIN D 25 Hydroxy (Vit-D Deficiency, Fractures)     Status: None   Collection Time: 02/04/23  8:28 AM  Result Value Ref Range   Vit D, 25-Hydroxy 36.2 30.0 - 100.0 ng/mL    Comment: Vitamin D deficiency has been defined by the Institute of Medicine and an Endocrine Society practice guideline as a level of serum 25-OH vitamin D less than 20 ng/mL (1,2). The Endocrine Society went on to further define vitamin D insufficiency as a level between 21 and 29 ng/mL (2). 1. IOM (Institute of Medicine). 2010. Dietary reference    intakes for calcium and D. [place]: The    [employer]. 2. Holick MF, Binkley [place], Bischoff-Ferrari HA, et al.    Evaluation, treatment, and prevention of vitamin D    deficiency: an Endocrine Society clinical practice    guideline. JCEM. 2011 Jul; 96(7):1911-30.   B12 and Folate Panel     Status: None   Collection Time: 02/04/23  8:28 AM  Result Value Ref Range   Vitamin B-12 1,100 232 - 1,245 pg/mL   Folate 12.7 >3.0 ng/mL    Comment: A serum folate concentration of less  than 3.1 ng/mL is considered to represent clinical deficiency.   Fe+TIBC+Fer     Status: None   Collection Time: 02/04/23  8:28 AM  Result Value Ref Range   Total Iron Binding Capacity 357 250 - 450 ug/dL   UIBC 010 272 - 536 ug/dL   Iron 99 27 - 644 ug/dL   Iron Saturation 28 15 - 55 %   Ferritin 27 15 - 150 ng/mL        Assessment/Plan: 1. Encounter for general adult medical examination with abnormal findings CPE performed, labs ordered, mammo ordered, due for colon screening  2. Type 2 diabetes mellitus with hyperglycemia, without long-term current use of insulin (HCC) - POCT HgB A1C is 7.4 which is up from 7.1  last visit. Will add farxiga and taper down metformin to 500mg  in AM - dapagliflozin propanediol (FARXIGA) 10 MG TABS tablet; Take 1 tablet (10 mg total) by mouth daily before breakfast. Start with 1/2 tab daily for 1 week then increase to daily.  Dispense: 30 tablet; Refill: 2  3. Vitamin D deficiency - VITAMIN D 25 Hydroxy (Vit-D Deficiency, Fractures)  4. B12 deficiency - B12 and Folate Panel  5. Abnormal thyroid exam - TSH + free T4  6. Mixed hyperlipidemia - Lipid Panel With LDL/HDL Ratio  7. Other fatigue - CBC w/Diff/Platelet - Comprehensive metabolic panel - TSH +  free T4 - Lipid Panel With LDL/HDL Ratio - VITAMIN D 25 Hydroxy (Vit-D Deficiency, Fractures) - B12 and Folate Panel - Fe+TIBC+Fer  8. Visit for screening mammogram - MM 3D SCREENING MAMMOGRAM BILATERAL BREAST; Future  9. Dysuria - UA/M w/rflx Culture, Routine - Microscopic Examination   General Counseling: Avari verbalizes understanding of the findings of todays visit and agrees with plan of treatment. I have discussed any further diagnostic evaluation that may be needed or ordered today. We also reviewed her medications today. she has been encouraged to call the office with any questions or concerns that should arise related to todays visit.    Counseling:    Orders Placed  This Encounter  Procedures   Microscopic Examination   MM 3D SCREENING MAMMOGRAM BILATERAL BREAST   UA/M w/rflx Culture, Routine   CBC w/Diff/Platelet   Comprehensive metabolic panel   TSH + free T4   Lipid Panel With LDL/HDL Ratio   VITAMIN D 25 Hydroxy (Vit-D Deficiency, Fractures)   B12 and Folate Panel   Fe+TIBC+Fer   POCT HgB A1C    Meds ordered this encounter  Medications   dapagliflozin propanediol (FARXIGA) 10 MG TABS tablet    Sig: Take 1 tablet (10 mg total) by mouth daily before breakfast. Start with 1/2 tab daily for 1 week then increase to daily.    Dispense:  30 tablet    Refill:  2    This patient was seen by Lynn Ito, PA-C in collaboration with Dr. Beverely Risen as a part of collaborative care agreement.  Total time spent:35 Minutes  Time spent includes review of chart, medications, test results, and follow up plan with the patient.     Lyndon Code, MD  Internal Medicine

## 2023-01-31 LAB — MICROSCOPIC EXAMINATION
Casts: NONE SEEN /LPF
WBC, UA: NONE SEEN /HPF (ref 0–5)

## 2023-01-31 LAB — UA/M W/RFLX CULTURE, ROUTINE
Bilirubin, UA: NEGATIVE
Glucose, UA: NEGATIVE
Ketones, UA: NEGATIVE
Leukocytes,UA: NEGATIVE
Nitrite, UA: NEGATIVE
Protein,UA: NEGATIVE
RBC, UA: NEGATIVE
Specific Gravity, UA: 1.014 (ref 1.005–1.030)
Urobilinogen, Ur: 0.2 mg/dL (ref 0.2–1.0)
pH, UA: 5.5 (ref 5.0–7.5)

## 2023-02-01 DIAGNOSIS — Z419 Encounter for procedure for purposes other than remedying health state, unspecified: Secondary | ICD-10-CM | POA: Diagnosis not present

## 2023-02-04 DIAGNOSIS — R5383 Other fatigue: Secondary | ICD-10-CM | POA: Diagnosis not present

## 2023-02-04 DIAGNOSIS — R946 Abnormal results of thyroid function studies: Secondary | ICD-10-CM | POA: Diagnosis not present

## 2023-02-04 DIAGNOSIS — E559 Vitamin D deficiency, unspecified: Secondary | ICD-10-CM | POA: Diagnosis not present

## 2023-02-04 DIAGNOSIS — E782 Mixed hyperlipidemia: Secondary | ICD-10-CM | POA: Diagnosis not present

## 2023-02-04 DIAGNOSIS — E538 Deficiency of other specified B group vitamins: Secondary | ICD-10-CM | POA: Diagnosis not present

## 2023-02-05 LAB — LIPID PANEL WITH LDL/HDL RATIO
Cholesterol, Total: 204 mg/dL — ABNORMAL HIGH (ref 100–199)
HDL: 86 mg/dL (ref >39)
LDL Chol Calc (NIH): 100 mg/dL — ABNORMAL HIGH (ref 0–99)
LDL/HDL Ratio: 1.2 ratio (ref 0.0–3.2)
Triglycerides: 104 mg/dL (ref 0–149)
VLDL Cholesterol Cal: 18 mg/dL (ref 5–40)

## 2023-02-05 LAB — B12 AND FOLATE PANEL
Folate: 12.7 ng/mL (ref >3.0)
Vitamin B-12: 1100 pg/mL (ref 232–1245)

## 2023-02-05 LAB — COMPREHENSIVE METABOLIC PANEL
ALT: 6 IU/L (ref 0–32)
AST: 13 IU/L (ref 0–40)
Albumin: 3.8 g/dL (ref 3.8–4.9)
Alkaline Phosphatase: 78 IU/L (ref 44–121)
BUN/Creatinine Ratio: 8 — ABNORMAL LOW (ref 9–23)
BUN: 7 mg/dL (ref 6–24)
Bilirubin Total: 0.7 mg/dL (ref 0.0–1.2)
CO2: 23 mmol/L (ref 20–29)
Calcium: 9.3 mg/dL (ref 8.7–10.2)
Chloride: 104 mmol/L (ref 96–106)
Creatinine, Ser: 0.85 mg/dL (ref 0.57–1.00)
Globulin, Total: 3.1 g/dL (ref 1.5–4.5)
Glucose: 122 mg/dL — ABNORMAL HIGH (ref 70–99)
Potassium: 4.1 mmol/L (ref 3.5–5.2)
Sodium: 139 mmol/L (ref 134–144)
Total Protein: 6.9 g/dL (ref 6.0–8.5)
eGFR: 82 mL/min/{1.73_m2} (ref >59)

## 2023-02-05 LAB — CBC WITH DIFFERENTIAL/PLATELET
Basophils Absolute: 0 10*3/uL (ref 0.0–0.2)
Basos: 1 %
EOS (ABSOLUTE): 0.1 10*3/uL (ref 0.0–0.4)
Eos: 1 %
Hematocrit: 41.3 % (ref 34.0–46.6)
Hemoglobin: 13 g/dL (ref 11.1–15.9)
Immature Grans (Abs): 0 10*3/uL (ref 0.0–0.1)
Immature Granulocytes: 0 %
Lymphocytes Absolute: 3 10*3/uL (ref 0.7–3.1)
Lymphs: 42 %
MCH: 26.1 pg — ABNORMAL LOW (ref 26.6–33.0)
MCHC: 31.5 g/dL (ref 31.5–35.7)
MCV: 83 fL (ref 79–97)
Monocytes Absolute: 0.7 10*3/uL (ref 0.1–0.9)
Monocytes: 10 %
Neutrophils Absolute: 3.4 10*3/uL (ref 1.4–7.0)
Neutrophils: 46 %
Platelets: 217 10*3/uL (ref 150–450)
RBC: 4.98 x10E6/uL (ref 3.77–5.28)
RDW: 12.8 % (ref 11.7–15.4)
WBC: 7.2 10*3/uL (ref 3.4–10.8)

## 2023-02-05 LAB — TSH+FREE T4
Free T4: 1.02 ng/dL (ref 0.82–1.77)
TSH: 1.73 u[IU]/mL (ref 0.450–4.500)

## 2023-02-05 LAB — IRON,TIBC AND FERRITIN PANEL
Ferritin: 27 ng/mL (ref 15–150)
Iron Saturation: 28 % (ref 15–55)
Iron: 99 ug/dL (ref 27–159)
Total Iron Binding Capacity: 357 ug/dL (ref 250–450)
UIBC: 258 ug/dL (ref 131–425)

## 2023-02-05 LAB — VITAMIN D 25 HYDROXY (VIT D DEFICIENCY, FRACTURES): Vit D, 25-Hydroxy: 36.2 ng/mL (ref 30.0–100.0)

## 2023-02-06 ENCOUNTER — Telehealth: Payer: Self-pay

## 2023-02-06 NOTE — Telephone Encounter (Signed)
Spoke with patient regarding lab results. 

## 2023-02-06 NOTE — Telephone Encounter (Signed)
-----   Message from Carlean Jews sent at 02/05/2023  2:39 PM EDT ----- Please let her know that overall her labs look good. Her cholesterol did go up some. It is recommended for all diabetics to be on a statin medication to help reduce CV risk and this helps to lower cholesterol. If she is agreeable we could start with just 5mg  crestor 2 days per week.

## 2023-02-12 ENCOUNTER — Telehealth: Payer: Self-pay

## 2023-02-13 NOTE — Telephone Encounter (Signed)
Patient said she will call her pharmacy and see how much the jardiance will be then get back to [place].

## 2023-02-17 ENCOUNTER — Other Ambulatory Visit: Payer: Self-pay | Admitting: Physician Assistant

## 2023-02-17 DIAGNOSIS — E782 Mixed hyperlipidemia: Secondary | ICD-10-CM

## 2023-02-17 MED ORDER — ROSUVASTATIN CALCIUM 5 MG PO TABS
ORAL_TABLET | ORAL | 3 refills | Status: DC
Start: 2023-02-17 — End: 2023-07-20

## 2023-02-25 DIAGNOSIS — E119 Type 2 diabetes mellitus without complications: Secondary | ICD-10-CM | POA: Diagnosis not present

## 2023-03-03 DIAGNOSIS — Z419 Encounter for procedure for purposes other than remedying health state, unspecified: Secondary | ICD-10-CM | POA: Diagnosis not present

## 2023-03-05 ENCOUNTER — Ambulatory Visit (INDEPENDENT_AMBULATORY_CARE_PROVIDER_SITE_OTHER): Payer: Self-pay

## 2023-03-05 DIAGNOSIS — Z23 Encounter for immunization: Secondary | ICD-10-CM

## 2023-03-12 ENCOUNTER — Encounter: Payer: Self-pay | Admitting: Physician Assistant

## 2023-03-12 ENCOUNTER — Ambulatory Visit (INDEPENDENT_AMBULATORY_CARE_PROVIDER_SITE_OTHER): Payer: 59 | Admitting: Physician Assistant

## 2023-03-12 VITALS — BP 115/70 | HR 79 | Temp 98.4°F | Resp 16 | Ht 61.0 in | Wt 125.8 lb

## 2023-03-12 DIAGNOSIS — M67431 Ganglion, right wrist: Secondary | ICD-10-CM

## 2023-03-12 DIAGNOSIS — E1165 Type 2 diabetes mellitus with hyperglycemia: Secondary | ICD-10-CM

## 2023-03-12 NOTE — Progress Notes (Signed)
[facility] [address] [place], [place] [address]  Internal MEDICINE  Office Visit Note  Patient Name: Stacie Flores  [id number]  [id number]  Date of Service: 03/12/2023  Chief Complaint  Patient presents with   Follow-up   Diabetes   Wrist Problem    Cyst or bug bite on right wrist, been there for 2 weeks, not painful   Quality Metric Gaps    Colonoscopy    HPI Pt is here for routine follow up -Was afraid of jardiance S/E and did not pick it up. Jardiance preferred over farxiga per insurance, but would rather hold off for now as she admits she has not been doing what she is supposed to recently with her diet -Did cut back on metformin due to diarrhea and was taking 1.2 at night and 1 tab in AM, discussed flipping this to 1/2 in Am before work to reduce GI upset at work, may need to scale back further -walking 3 miles per day now. Admits she had been stress eating and eating a lot of donuts recently and has stopped now. Getting back into diet.  -BG Am readings 150-178 recently -increasing water, doing some green tea with pomegranate as well -superficial bump on right wrist started 2 weeks ago, no bothersome. Likely cyst but will continue to monitor. Could consider [place] vs ortho referral if any changes or concerns  Current Medication: Outpatient Encounter Medications as of 03/12/2023  Medication Sig   Ascorbic Acid (VITAMIN C) 1000 MG tablet Take 1,000 mg by mouth daily.   cholecalciferol (VITAMIN D3) 25 MCG (1000 UT) tablet Take 1,000 Units by mouth daily.   Cyanocobalamin (CVS B12 GUMMIES PO) Take by mouth.   doxycycline (VIBRA-TABS) 100 MG tablet Take 1 tablet (100 mg total) by mouth 2 (two) times daily.   Flaxseed, Linseed, (GNP FLAX SEED OIL) 1000 MG CAPS Take by mouth.   glucose blood test strip Use as instructed   Magnesium 100 MG TABS Take by mouth.   metFORMIN (GLUCOPHAGE) 1000 MG tablet Take 1 tablet (1,000 mg total) by mouth 2 (two) times daily with a  meal.   rosuvastatin (CRESTOR) 5 MG tablet Take 1 tablet by mouth 2 nights per week.   [DISCONTINUED] dapagliflozin propanediol (FARXIGA) 10 MG TABS tablet Take 1 tablet (10 mg total) by mouth daily before breakfast. Start with 1/2 tab daily for 1 week then increase to daily.   No facility-administered encounter medications on file as of 03/12/2023.    Surgical History: Past Surgical History:  Procedure Laterality Date   CESAREAN SECTION     LEEP      Medical History: Past Medical History:  Diagnosis Date   Asthma    Diabetes mellitus without complication (HCC)    Stroke (HCC)     Family History: Family History  Problem Relation Age of Onset   Breast cancer Sister        early 8's    Social History   Socioeconomic History   Marital status: Single    Spouse name: Not on file   Number of children: Not on file   Years of education: Not on file   Highest education level: Not on file  Occupational History   Not on file  Tobacco Use   Smoking status: Never   Smokeless tobacco: Never  Substance and Sexual Activity   Alcohol use: No   Drug use: No   Sexual activity: Yes  Other Topics Concern   Not on file  Social History Narrative   Live with family   Social Determinants of Health   Financial Resource Strain: Low Risk  (04/09/2018)   Overall Financial Resource Strain (CARDIA)    Difficulty of Paying Living Expenses: Not hard at all  Food Insecurity: No Food Insecurity (04/09/2018)   Hunger Vital Sign    Worried About Running Out of Food in the Last Year: Never true    Ran Out of Food in the Last Year: Never true  Transportation Needs: No Transportation Needs (04/09/2018)   PRAPARE - Administrator, Civil Service (Medical): No    Lack of Transportation (Non-Medical): No  Physical Activity: Insufficiently Active (04/09/2018)   Exercise Vital Sign    Days of Exercise per Week: 5 days    Minutes of Exercise per Session: 20 min  Stress: No Stress Concern  Present (04/09/2018)   [employer] of Occupational Health - Occupational Stress Questionnaire    Feeling of Stress : Not at all  Social Connections: Somewhat Isolated (04/09/2018)   Social Connection and Isolation Panel [NHANES]    Frequency of Communication with Friends and Family: Twice a week    Frequency of Social Gatherings with Friends and Family: Twice a week    Attends Religious Services: 1 to 4 times per year    Active Member of [employer] or Organizations: No    Attends Banker Meetings: Never    Marital Status: Divorced  Catering manager Violence: Not At Risk (04/09/2018)   Humiliation, Afraid, Rape, and Kick questionnaire    Fear of Current or Ex-Partner: No    Emotionally Abused: No    Physically Abused: No    Sexually Abused: No      Review of Systems  Constitutional:  Negative for chills, fatigue and unexpected weight change.  HENT:  Negative for congestion, postnasal drip, rhinorrhea, sneezing and sore throat.   Eyes:  Negative for redness.  Respiratory:  Negative for cough, chest tightness and shortness of breath.   Cardiovascular:  Negative for chest pain and palpitations.  Gastrointestinal:  Positive for diarrhea. Negative for abdominal pain, constipation, nausea and vomiting.  Genitourinary:  Negative for dysuria and frequency.  Musculoskeletal:  Positive for arthralgias. Negative for back pain, joint swelling and neck pain.  Skin:  Negative for rash.       Bump on right wrist  Neurological: Negative.  Negative for tremors and numbness.  Hematological:  Negative for adenopathy. Does not bruise/bleed easily.  Psychiatric/Behavioral:  Negative for behavioral problems (Depression), sleep disturbance and suicidal ideas. The patient is not nervous/anxious.     Vital Signs: BP 115/70   Pulse 79   Temp 98.4 F (36.9 C)   Resp 16   Ht 5\' 1"  (1.549 m)   Wt 125 lb 12.8 oz (57.1 kg)   SpO2 98%   BMI 23.77 kg/m    Physical Exam Vitals and nursing  note reviewed.  Constitutional:      General: She is not in acute distress.    Appearance: She is well-developed and normal weight. She is not diaphoretic.  HENT:     Head: Normocephalic and atraumatic.     Mouth/Throat:     Pharynx: No oropharyngeal exudate.  Eyes:     Pupils: Pupils are equal, round, and reactive to light.  Neck:     Thyroid: No thyromegaly.     Vascular: No JVD.     Trachea: No tracheal deviation.  Cardiovascular:     Rate and Rhythm:  Normal rate and regular rhythm.     Heart sounds: Normal heart sounds. No murmur heard.    No friction rub. No gallop.  Pulmonary:     Effort: Pulmonary effort is normal. No respiratory distress.     Breath sounds: No wheezing or rales.  Chest:     Chest wall: No tenderness.  Abdominal:     General: Bowel sounds are normal.     Palpations: Abdomen is soft.  Musculoskeletal:        General: Normal range of motion.     Cervical back: Normal range of motion and neck supple.  Lymphadenopathy:     Cervical: No cervical adenopathy.  Skin:    General: Skin is warm and dry.     Comments: 1.5cm well circumscribed bump on radial dorsal right wrist, no erythema or warmth. Likely cyst  Neurological:     Mental Status: She is alert and oriented to person, place, and time.     Cranial Nerves: No cranial nerve deficit.  Psychiatric:        Behavior: Behavior normal.        Thought Content: Thought content normal.        Judgment: Judgment normal.        Assessment/Plan: 1. Type 2 diabetes mellitus with hyperglycemia, without long-term current use of insulin (HCC) Will work on diet and exercise and monitor BG closely. Will hold off on SGLT2 at this time, but may need to reconsider addition of Jardiance  2. Ganglion cyst of dorsum of right wrist Likely ganglion cyst on right wrist without pain. Will monitor and can consider [place] vs ortho referral   General Counseling: Adeli verbalizes understanding of the findings of todays visit  and agrees with plan of treatment. I have discussed any further diagnostic evaluation that may be needed or ordered today. We also reviewed her medications today. she has been encouraged to call the office with any questions or concerns that should arise related to todays visit.    No orders of the defined types were placed in this encounter.   No orders of the defined types were placed in this encounter.   This patient was seen by Lynn Ito, PA-C in collaboration with Dr. Beverely Risen as a part of collaborative care agreement.   Total time spent:30 Minutes Time spent includes review of chart, medications, test results, and follow up plan with the patient.      Dr Lyndon Code Internal medicine

## 2023-03-25 ENCOUNTER — Ambulatory Visit
Admission: RE | Admit: 2023-03-25 | Discharge: 2023-03-25 | Disposition: A | Discharge status: Home / Self Care | Payer: 59 | Source: Ambulatory Visit | Attending: Physician Assistant | Admitting: Physician Assistant

## 2023-03-25 DIAGNOSIS — Z1231 Encounter for screening mammogram for malignant neoplasm of breast: Secondary | ICD-10-CM | POA: Diagnosis not present

## 2023-04-03 DIAGNOSIS — Z419 Encounter for procedure for purposes other than remedying health state, unspecified: Secondary | ICD-10-CM | POA: Diagnosis not present

## 2023-04-13 ENCOUNTER — Ambulatory Visit (INDEPENDENT_AMBULATORY_CARE_PROVIDER_SITE_OTHER): Payer: 59 | Admitting: Physician Assistant

## 2023-04-13 ENCOUNTER — Encounter: Payer: Self-pay | Admitting: Physician Assistant

## 2023-04-13 VITALS — BP 130/80 | HR 69 | Temp 97.6°F | Resp 16 | Ht 61.0 in | Wt 130.0 lb

## 2023-04-13 DIAGNOSIS — E1165 Type 2 diabetes mellitus with hyperglycemia: Secondary | ICD-10-CM | POA: Diagnosis not present

## 2023-04-13 NOTE — Progress Notes (Signed)
[facility] [address] [place], [place] [address]  Internal MEDICINE  Office Visit Note  Patient Name: Stacie Flores  [id number]  [id number]  Date of Service: 04/21/2023  Chief Complaint  Patient presents with   Follow-up   Diabetes    HPI Pt is here for routine follow up -wrist mass is gone, likely cyst -BG have been improving, In Am 115 at lowest, will fluctuate up on cycle. Avg 130 in AM.  -Started crestor in AM 2x/week -Back to taking metformin 1 in AM and 1 in PM -Trying Ollypop -more veggies now, cut back on sweets like donuts and oreos -Mammogram done -still has cologuard to do, needs to do pap -had second shingles vaccine thinks it was in March, due for tdap  Current Medication: Outpatient Encounter Medications as of 04/13/2023  Medication Sig   Ascorbic Acid (VITAMIN C) 1000 MG tablet Take 1,000 mg by mouth daily.   cholecalciferol (VITAMIN D3) 25 MCG (1000 UT) tablet Take 1,000 Units by mouth daily.   Cyanocobalamin (CVS B12 GUMMIES PO) Take by mouth.   doxycycline (VIBRA-TABS) 100 MG tablet Take 1 tablet (100 mg total) by mouth 2 (two) times daily.   Flaxseed, Linseed, (GNP FLAX SEED OIL) 1000 MG CAPS Take by mouth.   glucose blood test strip Use as instructed   Magnesium 100 MG TABS Take by mouth.   metFORMIN (GLUCOPHAGE) 1000 MG tablet Take 1 tablet (1,000 mg total) by mouth 2 (two) times daily with a meal.   rosuvastatin (CRESTOR) 5 MG tablet Take 1 tablet by mouth 2 nights per week.   No facility-administered encounter medications on file as of 04/13/2023.    Surgical History: Past Surgical History:  Procedure Laterality Date   CESAREAN SECTION     LEEP      Medical History: Past Medical History:  Diagnosis Date   Asthma    Diabetes mellitus without complication (HCC)    Stroke (HCC)     Family History: Family History  Problem Relation Age of Onset   Breast cancer Sister        early 31's   BRCA 1/2 Neg Hx     Social  History   Socioeconomic History   Marital status: Single    Spouse name: Not on file   Number of children: Not on file   Years of education: Not on file   Highest education level: Not on file  Occupational History   Not on file  Tobacco Use   Smoking status: Never   Smokeless tobacco: Never  Substance and Sexual Activity   Alcohol use: No   Drug use: No   Sexual activity: Yes  Other Topics Concern   Not on file  Social History Narrative   Live with family   Social Determinants of Health   Financial Resource Strain: Low Risk  (04/09/2018)   Overall Financial Resource Strain (CARDIA)    Difficulty of Paying Living Expenses: Not hard at all  Food Insecurity: No Food Insecurity (04/09/2018)   Hunger Vital Sign    Worried About Running Out of Food in the Last Year: Never true    Ran Out of Food in the Last Year: Never true  Transportation Needs: No Transportation Needs (04/09/2018)   PRAPARE - Administrator, Civil Service (Medical): No    Lack of Transportation (Non-Medical): No  Physical Activity: Insufficiently Active (04/09/2018)   Exercise Vital Sign    Days of Exercise per Week: 5 days  Minutes of Exercise per Session: 20 min  Stress: No Stress Concern Present (04/09/2018)   [employer] of Occupational Health - Occupational Stress Questionnaire    Feeling of Stress : Not at all  Social Connections: Somewhat Isolated (04/09/2018)   Social Connection and Isolation Panel [NHANES]    Frequency of Communication with Friends and Family: Twice a week    Frequency of Social Gatherings with Friends and Family: Twice a week    Attends Religious Services: 1 to 4 times per year    Active Member of [employer] or Organizations: No    Attends Banker Meetings: Never    Marital Status: Divorced  Catering manager Violence: Not At Risk (04/09/2018)   Humiliation, Afraid, Rape, and Kick questionnaire    Fear of Current or Ex-Partner: No    Emotionally Abused: No     Physically Abused: No    Sexually Abused: No      Review of Systems  Constitutional:  Negative for chills, fatigue and unexpected weight change.  HENT:  Negative for congestion, postnasal drip, rhinorrhea, sneezing and sore throat.   Eyes:  Negative for redness.  Respiratory:  Negative for cough, chest tightness and shortness of breath.   Cardiovascular:  Negative for chest pain and palpitations.  Gastrointestinal:  Negative for abdominal pain, constipation, nausea and vomiting.  Genitourinary:  Negative for dysuria and frequency.  Musculoskeletal:  Negative for arthralgias, back pain, joint swelling and neck pain.  Skin:  Negative for rash.  Neurological: Negative.  Negative for tremors and numbness.  Hematological:  Negative for adenopathy. Does not bruise/bleed easily.  Psychiatric/Behavioral:  Negative for behavioral problems (Depression), sleep disturbance and suicidal ideas. The patient is not nervous/anxious.     Vital Signs: BP 130/80   Pulse 69   Temp 97.6 F (36.4 C)   Resp 16   Ht 5\' 1"  (1.549 m)   Wt 130 lb (59 kg)   LMP 02/25/2023   SpO2 98%   BMI 24.56 kg/m    Physical Exam Vitals and nursing note reviewed.  Constitutional:      General: She is not in acute distress.    Appearance: She is well-developed and normal weight. She is not diaphoretic.  HENT:     Head: Normocephalic and atraumatic.     Mouth/Throat:     Pharynx: No oropharyngeal exudate.  Eyes:     Pupils: Pupils are equal, round, and reactive to light.  Neck:     Thyroid: No thyromegaly.     Vascular: No JVD.     Trachea: No tracheal deviation.  Cardiovascular:     Rate and Rhythm: Normal rate and regular rhythm.     Heart sounds: Normal heart sounds. No murmur heard.    No friction rub. No gallop.  Pulmonary:     Effort: Pulmonary effort is normal. No respiratory distress.     Breath sounds: No wheezing or rales.  Chest:     Chest wall: No tenderness.  Abdominal:     General:  Bowel sounds are normal.     Palpations: Abdomen is soft.  Musculoskeletal:        General: Normal range of motion.     Cervical back: Normal range of motion and neck supple.  Lymphadenopathy:     Cervical: No cervical adenopathy.  Skin:    General: Skin is warm and dry.  Neurological:     Mental Status: She is alert and oriented to person, place, and time.  Cranial Nerves: No cranial nerve deficit.  Psychiatric:        Behavior: Behavior normal.        Thought Content: Thought content normal.        Judgment: Judgment normal.        Assessment/Plan: 1. Type 2 diabetes mellitus with hyperglycemia, without long-term current use of insulin (HCC) Really working on diet/exercise, sugars improving. Will recheck A1c next visit   General Counseling: Constance verbalizes understanding of the findings of todays visit and agrees with plan of treatment. I have discussed any further diagnostic evaluation that may be needed or ordered today. We also reviewed her medications today. she has been encouraged to call the office with any questions or concerns that should arise related to todays visit.    No orders of the defined types were placed in this encounter.   No orders of the defined types were placed in this encounter.   This patient was seen by Lynn Ito, PA-C in collaboration with Dr. Beverely Risen as a part of collaborative care agreement.   Total time spent:30 Minutes Time spent includes review of chart, medications, test results, and follow up plan with the patient.      Dr Lyndon Code Internal medicine

## 2023-05-03 DIAGNOSIS — Z419 Encounter for procedure for purposes other than remedying health state, unspecified: Secondary | ICD-10-CM | POA: Diagnosis not present

## 2023-05-18 ENCOUNTER — Ambulatory Visit: Payer: 59 | Admitting: Physician Assistant

## 2023-05-19 ENCOUNTER — Other Ambulatory Visit: Payer: Self-pay | Admitting: Internal Medicine

## 2023-05-19 DIAGNOSIS — E1165 Type 2 diabetes mellitus with hyperglycemia: Secondary | ICD-10-CM

## 2023-06-03 DIAGNOSIS — Z419 Encounter for procedure for purposes other than remedying health state, unspecified: Secondary | ICD-10-CM | POA: Diagnosis not present

## 2023-06-17 ENCOUNTER — Telehealth: Payer: Self-pay | Admitting: Physician Assistant

## 2023-06-17 NOTE — Telephone Encounter (Signed)
 Patient called regarding insurance change. She stated she was switching to [facility], but they stated we are not in network. Melecia was unable to determine since patient did not have her member ID yet. Patient called back again today regarding same issue. I asked her if she had her [employer] member ID, email confirmation, she stated she will have to pay premium 1st. I explained to her that Raina Bunting is still showing her active. She r/s her appointment to next month until she gets her insurance corrected-Toni

## 2023-06-25 ENCOUNTER — Ambulatory Visit: Payer: 59 | Admitting: Physician Assistant

## 2023-07-04 DIAGNOSIS — Z419 Encounter for procedure for purposes other than remedying health state, unspecified: Secondary | ICD-10-CM | POA: Diagnosis not present

## 2023-07-20 ENCOUNTER — Encounter: Payer: Self-pay | Admitting: Physician Assistant

## 2023-07-20 ENCOUNTER — Telehealth: Payer: Self-pay | Admitting: Physician Assistant

## 2023-07-20 ENCOUNTER — Ambulatory Visit (INDEPENDENT_AMBULATORY_CARE_PROVIDER_SITE_OTHER): Payer: BLUE CROSS/BLUE SHIELD | Admitting: Physician Assistant

## 2023-07-20 VITALS — BP 132/82 | HR 84 | Temp 98.4°F | Resp 16 | Ht 61.0 in | Wt 134.2 lb

## 2023-07-20 DIAGNOSIS — E1165 Type 2 diabetes mellitus with hyperglycemia: Secondary | ICD-10-CM

## 2023-07-20 DIAGNOSIS — E782 Mixed hyperlipidemia: Secondary | ICD-10-CM | POA: Diagnosis not present

## 2023-07-20 DIAGNOSIS — M79672 Pain in left foot: Secondary | ICD-10-CM

## 2023-07-20 DIAGNOSIS — M79671 Pain in right foot: Secondary | ICD-10-CM

## 2023-07-20 LAB — POCT GLYCOSYLATED HEMOGLOBIN (HGB A1C): Hemoglobin A1C: 7.5 % — AB (ref 4.0–5.6)

## 2023-07-20 MED ORDER — GLIMEPIRIDE 1 MG PO TABS: 1.0000 mg | ORAL_TABLET | Freq: Every day | ORAL | 2 refills | Status: DC

## 2023-07-20 MED ORDER — PRAVASTATIN SODIUM 10 MG PO TABS
10.0000 mg | ORAL_TABLET | Freq: Every day | ORAL | 2 refills | Status: AC
Start: 2023-07-20 — End: ?

## 2023-07-20 NOTE — Progress Notes (Signed)
 [facility] [address] [place], [place] [address]  Internal MEDICINE  Office Visit Note  Patient Name: Stacie Flores  [id number]  [id number]  Date of Service: 07/30/2023  Chief Complaint  Patient presents with   Follow-up   Diabetes   Medication Problem    Statin causing nausea/vomiting and HA   Quality Metric Gaps    Pneumonia vaccine    HPI Pt is here for routine follow up -Has been changing diet, taking 1000mg  metformin BID -has not been eating as much recently -BG in AM 127-180 in 2 weeks before cycle, states cravings through timing of cycle can cause sugar fluctuations -Crestor causing nausea and HA that day. Will try alternative -worried about shoes, may need diabetic shoes or something with more support when standing all day. She would like to see podiatry for diabetic foot care and foot pain. Possible plantar fasciitis contributing.   Current Medication: Outpatient Encounter Medications as of 07/20/2023  Medication Sig   Ascorbic Acid (VITAMIN C) 1000 MG tablet Take 1,000 mg by mouth daily.   cholecalciferol (VITAMIN D3) 25 MCG (1000 UT) tablet Take 1,000 Units by mouth daily.   Cyanocobalamin (CVS B12 GUMMIES PO) Take by mouth.   doxycycline (VIBRA-TABS) 100 MG tablet Take 1 tablet (100 mg total) by mouth 2 (two) times daily.   Flaxseed, Linseed, (GNP FLAX SEED OIL) 1000 MG CAPS Take by mouth.   glimepiride (AMARYL) 1 MG tablet Take 1 tablet (1 mg total) by mouth daily with breakfast.   glucose blood test strip Use as instructed   Magnesium 100 MG TABS Take by mouth.   metFORMIN (GLUCOPHAGE) 1000 MG tablet TAKE 1 TABLET BY MOUTH 2 TIMES DAILY WITH A MEAL   pravastatin (PRAVACHOL) 10 MG tablet Take 1 tablet (10 mg total) by mouth daily.   [DISCONTINUED] rosuvastatin (CRESTOR) 5 MG tablet Take 1 tablet by mouth 2 nights per week.   No facility-administered encounter medications on file as of 07/20/2023.    Surgical History: Past Surgical History:   Procedure Laterality Date   CESAREAN SECTION     LEEP      Medical History: Past Medical History:  Diagnosis Date   Asthma    Diabetes mellitus without complication (HCC)    Stroke (HCC)     Family History: Family History  Problem Relation Age of Onset   Breast cancer Sister        early 64's   BRCA 1/2 Neg Hx     Social History   Socioeconomic History   Marital status: Single    Spouse name: Not on file   Number of children: Not on file   Years of education: Not on file   Highest education level: Not on file  Occupational History   Not on file  Tobacco Use   Smoking status: Never   Smokeless tobacco: Never  Substance and Sexual Activity   Alcohol use: No   Drug use: No   Sexual activity: Yes  Other Topics Concern   Not on file  Social History Narrative   Live with family   Social Drivers of Health   Financial Resource Strain: Low Risk  (04/09/2018)   Overall Financial Resource Strain (CARDIA)    Difficulty of Paying Living Expenses: Not hard at all  Food Insecurity: No Food Insecurity (04/09/2018)   Hunger Vital Sign    Worried About Running Out of Food in the Last Year: Never true    Ran Out of Food in  the Last Year: Never true  Transportation Needs: No Transportation Needs (04/09/2018)   PRAPARE - Administrator, Civil Service (Medical): No    Lack of Transportation (Non-Medical): No  Physical Activity: Insufficiently Active (04/09/2018)   Exercise Vital Sign    Days of Exercise per Week: 5 days    Minutes of Exercise per Session: 20 min  Stress: No Stress Concern Present (04/09/2018)   [employer] of Occupational Health - Occupational Stress Questionnaire    Feeling of Stress : Not at all  Social Connections: Somewhat Isolated (04/09/2018)   Social Connection and Isolation Panel [NHANES]    Frequency of Communication with Friends and Family: Twice a week    Frequency of Social Gatherings with Friends and Family: Twice a week     Attends Religious Services: 1 to 4 times per year    Active Member of [employer] or Organizations: No    Attends Banker Meetings: Never    Marital Status: Divorced  Catering manager Violence: Not At Risk (04/09/2018)   Humiliation, Afraid, Rape, and Kick questionnaire    Fear of Current or Ex-Partner: No    Emotionally Abused: No    Physically Abused: No    Sexually Abused: No      Review of Systems  Constitutional:  Negative for chills, fatigue and unexpected weight change.  HENT:  Negative for congestion, postnasal drip, rhinorrhea, sneezing and sore throat.   Eyes:  Negative for redness.  Respiratory:  Negative for cough, chest tightness and shortness of breath.   Cardiovascular:  Negative for chest pain and palpitations.  Gastrointestinal:  Negative for abdominal pain, constipation, nausea and vomiting.  Genitourinary:  Negative for dysuria and frequency.  Musculoskeletal:  Negative for arthralgias, back pain, joint swelling and neck pain.  Skin:  Negative for rash.  Neurological: Negative.  Negative for tremors and numbness.  Hematological:  Negative for adenopathy. Does not bruise/bleed easily.  Psychiatric/Behavioral:  Negative for behavioral problems (Depression), sleep disturbance and suicidal ideas. The patient is not nervous/anxious.     Vital Signs: BP 132/82 Comment: 125/90  Pulse 84   Temp 98.4 F (36.9 C)   Resp 16   Ht 5\' 1"  (1.549 m)   Wt 134 lb 3.2 oz (60.9 kg)   SpO2 99%   BMI 25.36 kg/m    Physical Exam Vitals and nursing note reviewed.  Constitutional:      General: She is not in acute distress.    Appearance: She is well-developed and normal weight. She is not diaphoretic.  HENT:     Head: Normocephalic and atraumatic.     Mouth/Throat:     Pharynx: No oropharyngeal exudate.  Eyes:     Pupils: Pupils are equal, round, and reactive to light.  Neck:     Thyroid: No thyromegaly.     Vascular: No JVD.     Trachea: No tracheal  deviation.  Cardiovascular:     Rate and Rhythm: Normal rate and regular rhythm.     Heart sounds: Normal heart sounds. No murmur heard.    No friction rub. No gallop.  Pulmonary:     Effort: Pulmonary effort is normal. No respiratory distress.     Breath sounds: No wheezing or rales.  Chest:     Chest wall: No tenderness.  Abdominal:     General: Bowel sounds are normal.     Palpations: Abdomen is soft.  Musculoskeletal:        General: Normal range of  motion.     Cervical back: Normal range of motion and neck supple.  Lymphadenopathy:     Cervical: No cervical adenopathy.  Skin:    General: Skin is warm and dry.  Neurological:     Mental Status: She is alert and oriented to person, place, and time.     Cranial Nerves: No cranial nerve deficit.  Psychiatric:        Behavior: Behavior normal.        Thought Content: Thought content normal.        Judgment: Judgment normal.        Assessment/Plan: 1. Type 2 diabetes mellitus with hyperglycemia, without long-term current use of insulin (HCC) (Primary) - POCT HgB A1C is 7.5 which is up from 7.4 last check. Will continue metformin and will add amaryl 1mg  daily to help with better control in addition to working on diet and exercise - Urine Microalbumin w/creat. ratio - Ambulatory referral to Podiatry - glimepiride (AMARYL) 1 MG tablet; Take 1 tablet (1 mg total) by mouth daily with breakfast.  Dispense: 30 tablet; Refill: 2  2. Mixed hyperlipidemia Will switch to pravastatin due to S/E on crestor - pravastatin (PRAVACHOL) 10 MG tablet; Take 1 tablet (10 mg total) by mouth daily.  Dispense: 30 tablet; Refill: 2  3. Bilateral foot pain Will refer to podiatry. Possible plantar fasciitis, may need more supportive shoes - Ambulatory referral to Podiatry   General Counseling: Shani verbalizes understanding of the findings of todays visit and agrees with plan of treatment. I have discussed any further diagnostic evaluation that  may be needed or ordered today. We also reviewed her medications today. she has been encouraged to call the office with any questions or concerns that should arise related to todays visit.    Orders Placed This Encounter  Procedures   Urine Microalbumin w/creat. ratio   Ambulatory referral to Podiatry   POCT HgB A1C    Meds ordered this encounter  Medications   pravastatin (PRAVACHOL) 10 MG tablet    Sig: Take 1 tablet (10 mg total) by mouth daily.    Dispense:  30 tablet    Refill:  2   glimepiride (AMARYL) 1 MG tablet    Sig: Take 1 tablet (1 mg total) by mouth daily with breakfast.    Dispense:  30 tablet    Refill:  2    This patient was seen by Lynn Ito, PA-C in collaboration with Dr. Beverely Risen as a part of collaborative care agreement.   Total time spent:30 Minutes Time spent includes review of chart, medications, test results, and follow up plan with the patient.      Dr Lyndon Code Internal medicine

## 2023-07-20 NOTE — Telephone Encounter (Signed)
 Podiatry referral sent via Epic [facility]. Highlighted for patient on AVS-Toni

## 2023-07-21 LAB — MICROALBUMIN / CREATININE URINE RATIO
Creatinine, Urine: 158.2 mg/dL
Microalb/Creat Ratio: 3 mg/g{creat} (ref 0–29)
Microalbumin, Urine: 5.5 ug/mL

## 2023-07-30 ENCOUNTER — Ambulatory Visit: Payer: BLUE CROSS/BLUE SHIELD | Admitting: Podiatry

## 2023-08-01 DIAGNOSIS — Z419 Encounter for procedure for purposes other than remedying health state, unspecified: Secondary | ICD-10-CM | POA: Diagnosis not present

## 2023-08-20 ENCOUNTER — Ambulatory Visit: Payer: BLUE CROSS/BLUE SHIELD | Admitting: Physician Assistant

## 2023-08-24 ENCOUNTER — Ambulatory Visit: Admitting: Physician Assistant

## 2023-09-03 ENCOUNTER — Ambulatory Visit: Admitting: Physician Assistant

## 2023-09-12 DIAGNOSIS — Z419 Encounter for procedure for purposes other than remedying health state, unspecified: Secondary | ICD-10-CM | POA: Diagnosis not present

## 2023-10-01 ENCOUNTER — Encounter: Payer: Self-pay | Admitting: Physician Assistant

## 2023-10-01 ENCOUNTER — Ambulatory Visit (INDEPENDENT_AMBULATORY_CARE_PROVIDER_SITE_OTHER): Admitting: Physician Assistant

## 2023-10-01 VITALS — BP 112/70 | HR 90 | Temp 98.5°F | Resp 16 | Ht 61.0 in | Wt 135.0 lb

## 2023-10-01 DIAGNOSIS — E1165 Type 2 diabetes mellitus with hyperglycemia: Secondary | ICD-10-CM

## 2023-10-01 DIAGNOSIS — E782 Mixed hyperlipidemia: Secondary | ICD-10-CM

## 2023-10-01 NOTE — Progress Notes (Signed)
 [facility] [address] [place], [place] [address]  Internal MEDICINE  Office Visit Note  Patient Name: Stacie Flores  [id number]  [id number]  Date of Service: 10/13/2023  Chief Complaint  Patient presents with   Follow-up   Diabetes   Medication Problem    Glimepiride  side effects - sugar dropping    HPI Pt is here for routine follow up -Added glimepiride  last visit, BG dropping too low at times. Taking with breakfast. Lowest 96. Not getting dizzy, but feels heaviness in legs. May try doing 1/2 glimepiride  while adjusting. -pravastatin  tolerated well -will be getting back into exercise -will be getting pool at [facility] and plans to try water aerobics   Current Medication: Outpatient Encounter Medications as of 10/01/2023  Medication Sig   Ascorbic Acid (VITAMIN C) 1000 MG tablet Take 1,000 mg by mouth daily.   cholecalciferol  (VITAMIN D3) 25 MCG (1000 UT) tablet Take 1,000 Units by mouth daily.   Cyanocobalamin  (CVS B12 GUMMIES PO) Take by mouth.   doxycycline  (VIBRA -TABS) 100 MG tablet Take 1 tablet (100 mg total) by mouth 2 (two) times daily.   Flaxseed, Linseed, (GNP FLAX SEED OIL) 1000 MG CAPS Take by mouth.   glimepiride  (AMARYL ) 1 MG tablet Take 1 tablet (1 mg total) by mouth daily with breakfast.   glucose blood test strip Use as instructed   Magnesium 100 MG TABS Take by mouth.   metFORMIN  (GLUCOPHAGE ) 1000 MG tablet TAKE 1 TABLET BY MOUTH 2 TIMES DAILY WITH A MEAL   pravastatin  (PRAVACHOL ) 10 MG tablet Take 1 tablet (10 mg total) by mouth daily.   No facility-administered encounter medications on file as of 10/01/2023.    Surgical History: Past Surgical History:  Procedure Laterality Date   CESAREAN SECTION     LEEP      Medical History: Past Medical History:  Diagnosis Date   Asthma    Diabetes mellitus without complication (HCC)    Stroke (HCC)     Family History: Family History  Problem Relation Age of Onset   Breast cancer Sister         early 73's   BRCA 1/2 Neg Hx     Social History   Socioeconomic History   Marital status: Single    Spouse name: Not on file   Number of children: Not on file   Years of education: Not on file   Highest education level: Not on file  Occupational History   Not on file  Tobacco Use   Smoking status: Never   Smokeless tobacco: Never  Substance and Sexual Activity   Alcohol use: No   Drug use: No   Sexual activity: Yes  Other Topics Concern   Not on file  Social History Narrative   Live with family   Social Drivers of Health   Financial Resource Strain: Low Risk  (04/09/2018)   Overall Financial Resource Strain (CARDIA)    Difficulty of Paying Living Expenses: Not hard at all  Food Insecurity: No Food Insecurity (04/09/2018)   Hunger Vital Sign    Worried About Running Out of Food in the Last Year: Never true    Ran Out of Food in the Last Year: Never true  Transportation Needs: No Transportation Needs (04/09/2018)   PRAPARE - Administrator, Civil Service (Medical): No    Lack of Transportation (Non-Medical): No  Physical Activity: Insufficiently Active (04/09/2018)   Exercise Vital Sign    Days of Exercise per Week:  5 days    Minutes of Exercise per Session: 20 min  Stress: No Stress Concern Present (04/09/2018)   [employer] of Occupational Health - Occupational Stress Questionnaire    Feeling of Stress : Not at all  Social Connections: Somewhat Isolated (04/09/2018)   Social Connection and Isolation Panel [NHANES]    Frequency of Communication with Friends and Family: Twice a week    Frequency of Social Gatherings with Friends and Family: Twice a week    Attends Religious Services: 1 to 4 times per year    Active Member of [employer] or Organizations: No    Attends Banker Meetings: Never    Marital Status: Divorced  Catering manager Violence: Not At Risk (04/09/2018)   Humiliation, Afraid, Rape, and Kick questionnaire    Fear of Current or  Ex-Partner: No    Emotionally Abused: No    Physically Abused: No    Sexually Abused: No      Review of Systems  Constitutional:  Negative for chills, fatigue and unexpected weight change.  HENT:  Negative for congestion, postnasal drip, rhinorrhea, sneezing and sore throat.   Eyes:  Negative for redness.  Respiratory:  Negative for cough, chest tightness and shortness of breath.   Cardiovascular:  Negative for chest pain and palpitations.  Gastrointestinal:  Negative for abdominal pain, constipation, nausea and vomiting.  Genitourinary:  Negative for dysuria and frequency.  Musculoskeletal:  Negative for arthralgias, back pain, joint swelling and neck pain.  Skin:  Negative for rash.  Neurological: Negative.  Negative for tremors and numbness.  Hematological:  Negative for adenopathy. Does not bruise/bleed easily.  Psychiatric/Behavioral:  Negative for behavioral problems (Depression), sleep disturbance and suicidal ideas. The patient is not nervous/anxious.     Vital Signs: BP 112/70   Pulse 90 Comment: 104  Temp 98.5 F (36.9 C)   Resp 16   Ht 5\' 1"  (1.549 m)   Wt 135 lb (61.2 kg)   SpO2 98%   BMI 25.51 kg/m    Physical Exam Vitals and nursing note reviewed.  Constitutional:      General: She is not in acute distress.    Appearance: She is well-developed and normal weight. She is not diaphoretic.  HENT:     Head: Normocephalic and atraumatic.  Eyes:     Pupils: Pupils are equal, round, and reactive to light.  Neck:     Thyroid : No thyromegaly.     Vascular: No JVD.     Trachea: No tracheal deviation.  Cardiovascular:     Rate and Rhythm: Normal rate and regular rhythm.     Heart sounds: Normal heart sounds. No murmur heard.    No friction rub. No gallop.  Pulmonary:     Effort: Pulmonary effort is normal. No respiratory distress.     Breath sounds: No wheezing or rales.  Chest:     Chest wall: No tenderness.  Musculoskeletal:        General: Normal range  of motion.  Skin:    General: Skin is warm and dry.  Neurological:     Mental Status: She is alert and oriented to person, place, and time.  Psychiatric:        Behavior: Behavior normal.        Thought Content: Thought content normal.        Judgment: Judgment normal.        Assessment/Plan: 1. Type 2 diabetes mellitus with hyperglycemia, without long-term current use of insulin (HCC) (  Primary) Discussed a fasting BG of 96 is normal, but do not want to drop into 70s or if symptomatic with dizziness/lightheaded feeling. Will drop to 1/2 tab glimepiride  and monitor  2. Mixed hyperlipidemia Tolerating pravastatin  and will continue    General Counseling: Jannatul verbalizes understanding of the findings of todays visit and agrees with plan of treatment. I have discussed any further diagnostic evaluation that may be needed or ordered today. We also reviewed her medications today. she has been encouraged to call the office with any questions or concerns that should arise related to todays visit.    No orders of the defined types were placed in this encounter.   No orders of the defined types were placed in this encounter.   This patient was seen by Taylor Favia, PA-C in collaboration with Dr. Verneta Gone as a part of collaborative care agreement.   Total time spent:30 Minutes Time spent includes review of chart, medications, test results, and follow up plan with the patient.      Dr Fozia M Khan Internal medicine

## 2023-10-12 DIAGNOSIS — Z419 Encounter for procedure for purposes other than remedying health state, unspecified: Secondary | ICD-10-CM | POA: Diagnosis not present

## 2023-11-12 ENCOUNTER — Encounter: Payer: Self-pay | Admitting: Physician Assistant

## 2023-11-12 ENCOUNTER — Ambulatory Visit: Admitting: Physician Assistant

## 2023-11-12 VITALS — BP 120/70 | HR 63 | Temp 97.8°F | Resp 16 | Ht 61.0 in | Wt 137.0 lb

## 2023-11-12 DIAGNOSIS — Z419 Encounter for procedure for purposes other than remedying health state, unspecified: Secondary | ICD-10-CM | POA: Diagnosis not present

## 2023-11-12 DIAGNOSIS — E782 Mixed hyperlipidemia: Secondary | ICD-10-CM

## 2023-11-12 DIAGNOSIS — E1165 Type 2 diabetes mellitus with hyperglycemia: Secondary | ICD-10-CM

## 2023-11-12 DIAGNOSIS — Z794 Long term (current) use of insulin: Secondary | ICD-10-CM

## 2023-11-12 LAB — POCT GLYCOSYLATED HEMOGLOBIN (HGB A1C): Hemoglobin A1C: 7.5 % — AB (ref 4.0–5.6)

## 2023-11-12 NOTE — Progress Notes (Signed)
 [facility] [address] [place], [place] [address]  Internal MEDICINE  Office Visit Note  Patient Name: Stacie Flores  [id number]  [id number]  Date of Service: 11/12/2023  Chief Complaint  Patient presents with   Follow-up   Diabetes    HPI Pt is here for routine follow up -walking more, trying to get up to in future. Currently over a mile daily -Appetite down some since drinking tea, sugar free -Taking metformin  1000mg  BID, and 1/2 tab glimepiride . Able to eat on more regular schedule though with only 1 job currently. May be able to go up to full tab again-- may try 1/2 BID too -BG around 130 -tolerating pravastatin   Current Medication: Outpatient Encounter Medications as of 11/12/2023  Medication Sig   Ascorbic Acid (VITAMIN C) 1000 MG tablet Take 1,000 mg by mouth daily.   cholecalciferol  (VITAMIN D3) 25 MCG (1000 UT) tablet Take 1,000 Units by mouth daily.   Cyanocobalamin  (CVS B12 GUMMIES PO) Take by mouth.   Flaxseed, Linseed, (GNP FLAX SEED OIL) 1000 MG CAPS Take by mouth.   glucose blood test strip Use as instructed   Magnesium 100 MG TABS Take by mouth.   metFORMIN  (GLUCOPHAGE ) 1000 MG tablet TAKE 1 TABLET BY MOUTH 2 TIMES DAILY WITH A MEAL   [DISCONTINUED] doxycycline  (VIBRA -TABS) 100 MG tablet Take 1 tablet (100 mg total) by mouth 2 (two) times daily.   [DISCONTINUED] glimepiride  (AMARYL ) 1 MG tablet Take 1 tablet (1 mg total) by mouth daily with breakfast.   [DISCONTINUED] pravastatin  (PRAVACHOL ) 10 MG tablet Take 1 tablet (10 mg total) by mouth daily.   No facility-administered encounter medications on file as of 11/12/2023.    Surgical History: Past Surgical History:  Procedure Laterality Date   CESAREAN SECTION     LEEP      Medical History: Past Medical History:  Diagnosis Date   Asthma    Diabetes mellitus without complication (HCC)    Stroke (HCC)     Family History: Family History  Problem Relation Age of Onset   Breast  cancer Sister        early 65's   BRCA 1/2 Neg Hx     Social History   Socioeconomic History   Marital status: Single    Spouse name: Not on file   Number of children: Not on file   Years of education: Not on file   Highest education level: Not on file  Occupational History   Not on file  Tobacco Use   Smoking status: Never   Smokeless tobacco: Never  Substance and Sexual Activity   Alcohol use: No   Drug use: No   Sexual activity: Yes  Other Topics Concern   Not on file  Social History Narrative   Live with family   Social Drivers of Health   Financial Resource Strain: Low Risk  (04/09/2018)   Overall Financial Resource Strain (CARDIA)    Difficulty of Paying Living Expenses: Not hard at all  Food Insecurity: No Food Insecurity (04/09/2018)   Hunger Vital Sign    Worried About Running Out of Food in the Last Year: Never true    Ran Out of Food in the Last Year: Never true  Transportation Needs: No Transportation Needs (04/09/2018)   PRAPARE - Administrator, Civil Service (Medical): No    Lack of Transportation (Non-Medical): No  Physical Activity: Insufficiently Active (04/09/2018)   Exercise Vital Sign    Days of Exercise  per Week: 5 days    Minutes of Exercise per Session: 20 min  Stress: No Stress Concern Present (04/09/2018)   [employer] of Occupational Health - Occupational Stress Questionnaire    Feeling of Stress : Not at all  Social Connections: Somewhat Isolated (04/09/2018)   Social Connection and Isolation Panel    Frequency of Communication with Friends and Family: Twice a week    Frequency of Social Gatherings with Friends and Family: Twice a week    Attends Religious Services: 1 to 4 times per year    Active Member of [employer] or Organizations: No    Attends Banker Meetings: Never    Marital Status: Divorced  Catering manager Violence: Not At Risk (04/09/2018)   Humiliation, Afraid, Rape, and Kick questionnaire    Fear of  Current or Ex-Partner: No    Emotionally Abused: No    Physically Abused: No    Sexually Abused: No      Review of Systems  Constitutional:  Negative for chills, fatigue and unexpected weight change.  HENT:  Negative for congestion, postnasal drip, rhinorrhea, sneezing and sore throat.   Eyes:  Negative for redness.  Respiratory:  Negative for cough, chest tightness and shortness of breath.   Cardiovascular:  Negative for chest pain and palpitations.  Gastrointestinal:  Negative for abdominal pain, constipation, nausea and vomiting.  Genitourinary:  Negative for dysuria and frequency.  Musculoskeletal:  Negative for arthralgias, back pain, joint swelling and neck pain.  Skin:  Negative for rash.  Neurological: Negative.  Negative for tremors and numbness.  Hematological:  Negative for adenopathy. Does not bruise/bleed easily.  Psychiatric/Behavioral:  Negative for behavioral problems (Depression), sleep disturbance and suicidal ideas. The patient is not nervous/anxious.     Vital Signs: BP 120/70   Pulse 63   Temp 97.8 F (36.6 C)   Resp 16   Ht 5' 1 (1.549 m)   Wt 137 lb (62.1 kg)   SpO2 94%   BMI 25.89 kg/m    Physical Exam Vitals and nursing note reviewed.  Constitutional:      General: She is not in acute distress.    Appearance: She is well-developed and normal weight. She is not diaphoretic.  HENT:     Head: Normocephalic and atraumatic.   Eyes:     Pupils: Pupils are equal, round, and reactive to light.   Neck:     Thyroid : No thyromegaly.     Vascular: No JVD.     Trachea: No tracheal deviation.   Cardiovascular:     Rate and Rhythm: Normal rate and regular rhythm.     Heart sounds: Normal heart sounds. No murmur heard.    No friction rub. No gallop.  Pulmonary:     Effort: Pulmonary effort is normal. No respiratory distress.     Breath sounds: No wheezing or rales.  Chest:     Chest wall: No tenderness.   Musculoskeletal:        General: Normal  range of motion.   Skin:    General: Skin is warm and dry.   Neurological:     Mental Status: She is alert and oriented to person, place, and time.   Psychiatric:        Behavior: Behavior normal.        Thought Content: Thought content normal.        Judgment: Judgment normal.        Assessment/Plan: 1. Type 2 diabetes mellitus with hyperglycemia,  with long-term current use of insulin (HCC) (Primary) - POCT HgB A1C is 7.5 which is stable from last visit. Will increase glimepiride  for total of 1mg  daily and continue metformin  as before. Continue to work on diet and exercise  2. Mixed hyperlipidemia Continue pravastatin    General Counseling: Bianney verbalizes understanding of the findings of todays visit and agrees with plan of treatment. I have discussed any further diagnostic evaluation that may be needed or ordered today. We also reviewed her medications today. she has been encouraged to call the office with any questions or concerns that should arise related to todays visit.    Orders Placed This Encounter  Procedures   POCT HgB A1C    No orders of the defined types were placed in this encounter.   This patient was seen by Tinnie Pro, PA-C in collaboration with Dr. Sigrid Bathe as a part of collaborative care agreement.   Total time spent:30 Minutes Time spent includes review of chart, medications, test results, and follow up plan with the patient.      Dr Fozia M Khan Internal medicine

## 2023-11-17 ENCOUNTER — Other Ambulatory Visit: Payer: Self-pay | Admitting: Physician Assistant

## 2023-11-17 DIAGNOSIS — E782 Mixed hyperlipidemia: Secondary | ICD-10-CM

## 2023-11-17 DIAGNOSIS — E1165 Type 2 diabetes mellitus with hyperglycemia: Secondary | ICD-10-CM

## 2023-12-12 DIAGNOSIS — Z419 Encounter for procedure for purposes other than remedying health state, unspecified: Secondary | ICD-10-CM | POA: Diagnosis not present

## 2024-01-14 ENCOUNTER — Encounter: Payer: Self-pay | Admitting: Physician Assistant

## 2024-01-14 ENCOUNTER — Ambulatory Visit (INDEPENDENT_AMBULATORY_CARE_PROVIDER_SITE_OTHER): Admitting: Physician Assistant

## 2024-01-14 VITALS — BP 109/62 | HR 82 | Temp 98.0°F | Resp 16 | Ht 61.0 in | Wt 136.0 lb

## 2024-01-14 DIAGNOSIS — E1165 Type 2 diabetes mellitus with hyperglycemia: Secondary | ICD-10-CM

## 2024-01-14 DIAGNOSIS — R5383 Other fatigue: Secondary | ICD-10-CM

## 2024-01-14 DIAGNOSIS — E782 Mixed hyperlipidemia: Secondary | ICD-10-CM | POA: Diagnosis not present

## 2024-01-14 DIAGNOSIS — N390 Urinary tract infection, site not specified: Secondary | ICD-10-CM

## 2024-01-14 DIAGNOSIS — E559 Vitamin D deficiency, unspecified: Secondary | ICD-10-CM

## 2024-01-14 DIAGNOSIS — R300 Dysuria: Secondary | ICD-10-CM | POA: Diagnosis not present

## 2024-01-14 DIAGNOSIS — R319 Hematuria, unspecified: Secondary | ICD-10-CM

## 2024-01-14 DIAGNOSIS — E538 Deficiency of other specified B group vitamins: Secondary | ICD-10-CM

## 2024-01-14 DIAGNOSIS — R946 Abnormal results of thyroid function studies: Secondary | ICD-10-CM

## 2024-01-14 LAB — POCT URINALYSIS DIPSTICK
Bilirubin, UA: NEGATIVE
Blood, UA: POSITIVE
Glucose, UA: NEGATIVE
Ketones, UA: POSITIVE
Nitrite, UA: NEGATIVE
Protein, UA: POSITIVE — AB
Spec Grav, UA: 1.01 (ref 1.010–1.025)
Urobilinogen, UA: 0.2 U/dL
pH, UA: 5 (ref 5.0–8.0)

## 2024-01-14 MED ORDER — GLIMEPIRIDE 1 MG PO TABS: ORAL_TABLET | ORAL | 1 refills | Status: DC

## 2024-01-14 MED ORDER — METFORMIN HCL 1000 MG PO TABS: 1000.0000 mg | ORAL_TABLET | Freq: Two times a day (BID) | ORAL | 1 refills | Status: AC

## 2024-01-14 NOTE — Progress Notes (Addendum)
 [facility] [address] [place], [place] [address]  Internal MEDICINE  Office Visit Note  Patient Name: Stacie Flores  [id number]  [id number]  Date of Service: 01/14/2024  Chief Complaint  Patient presents with   Follow-up   Diabetes    HPI Pt is here for routine follow up -BG 146-164 in AM -1 glimepiride  in AM and 1/2 in evening; 1 tab metformin  BID. Will increase to 1mg  glimepiride  BID -walking not as consistent with rain, but getting a few times per week still -trying to also work on core -might have UTI, some pelvic pressure and odor with cloudy urine -urinary frequency and tiredness improved -due for labs for CPE  Current Medication: Outpatient Encounter Medications as of 01/14/2024  Medication Sig   Ascorbic Acid (VITAMIN C) 1000 MG tablet Take 1,000 mg by mouth daily.   cholecalciferol  (VITAMIN D3) 25 MCG (1000 UT) tablet Take 1,000 Units by mouth daily.   Cyanocobalamin  (CVS B12 GUMMIES PO) Take by mouth.   Flaxseed, Linseed, (GNP FLAX SEED OIL) 1000 MG CAPS Take by mouth.   glucose blood test strip Use as instructed   Magnesium 100 MG TABS Take by mouth.   [DISCONTINUED] glimepiride  (AMARYL ) 1 MG tablet TAKE ONE TABLET BY MOUTH ONCE DAILY WITHBREAKFAST   [DISCONTINUED] metFORMIN  (GLUCOPHAGE ) 1000 MG tablet TAKE 1 TABLET BY MOUTH 2 TIMES DAILY WITH A MEAL   [DISCONTINUED] pravastatin  (PRAVACHOL ) 10 MG tablet TAKE ONE TABLET BY MOUTH ONCE DAILY   glimepiride  (AMARYL ) 1 MG tablet Take 1 tablet by mouth twice daily   metFORMIN  (GLUCOPHAGE ) 1000 MG tablet Take 1 tablet (1,000 mg total) by mouth 2 (two) times daily with a meal.   No facility-administered encounter medications on file as of 01/14/2024.    Surgical History: Past Surgical History:  Procedure Laterality Date   CESAREAN SECTION     LEEP      Medical History: Past Medical History:  Diagnosis Date   Asthma    Diabetes mellitus without complication (HCC)    Stroke (HCC)     Family  History: Family History  Problem Relation Age of Onset   Breast cancer Sister        early 19's   BRCA 1/2 Neg Hx     Social History   Socioeconomic History   Marital status: Single    Spouse name: Not on file   Number of children: Not on file   Years of education: Not on file   Highest education level: Not on file  Occupational History   Not on file  Tobacco Use   Smoking status: Never   Smokeless tobacco: Never  Substance and Sexual Activity   Alcohol use: No   Drug use: No   Sexual activity: Yes  Other Topics Concern   Not on file  Social History Narrative   Live with family   Social Drivers of Health   Financial Resource Strain: Low Risk  (04/09/2018)   Overall Financial Resource Strain (CARDIA)    Difficulty of Paying Living Expenses: Not hard at all  Food Insecurity: No Food Insecurity (04/09/2018)   Hunger Vital Sign    Worried About Running Out of Food in the Last Year: Never true    Ran Out of Food in the Last Year: Never true  Transportation Needs: No Transportation Needs (04/09/2018)   PRAPARE - Administrator, Civil Service (Medical): No    Lack of Transportation (Non-Medical): No  Physical Activity: Insufficiently Active (04/09/2018)  Exercise Vital Sign    Days of Exercise per Week: 5 days    Minutes of Exercise per Session: 20 min  Stress: No Stress Concern Present (04/09/2018)   [employer] of Occupational Health - Occupational Stress Questionnaire    Feeling of Stress : Not at all  Social Connections: Somewhat Isolated (04/09/2018)   Social Connection and Isolation Panel    Frequency of Communication with Friends and Family: Twice a week    Frequency of Social Gatherings with Friends and Family: Twice a week    Attends Religious Services: 1 to 4 times per year    Active Member of [employer] or Organizations: No    Attends Banker Meetings: Never    Marital Status: Divorced  Catering manager Violence: Not At Risk  (04/09/2018)   Humiliation, Afraid, Rape, and Kick questionnaire    Fear of Current or Ex-Partner: No    Emotionally Abused: No    Physically Abused: No    Sexually Abused: No      Review of Systems  Constitutional:  Negative for chills, fatigue and unexpected weight change.  HENT:  Negative for congestion, postnasal drip, rhinorrhea, sneezing and sore throat.   Eyes:  Negative for redness.  Respiratory:  Negative for cough, chest tightness and shortness of breath.   Cardiovascular:  Negative for chest pain and palpitations.  Gastrointestinal:  Negative for abdominal pain, constipation, nausea and vomiting.  Genitourinary:  Positive for dysuria and frequency.  Musculoskeletal:  Negative for arthralgias, back pain, joint swelling and neck pain.  Skin:  Negative for rash.  Neurological: Negative.  Negative for tremors and numbness.  Hematological:  Negative for adenopathy. Does not bruise/bleed easily.  Psychiatric/Behavioral:  Negative for behavioral problems (Depression), sleep disturbance and suicidal ideas. The patient is not nervous/anxious.     Vital Signs: BP 109/62   Pulse 82   Temp 98 F (36.7 C)   Resp 16   Ht 5' 1 (1.549 m)   Wt 136 lb (61.7 kg)   SpO2 98%   BMI 25.70 kg/m    Physical Exam Vitals and nursing note reviewed.  Constitutional:      General: She is not in acute distress.    Appearance: She is well-developed and normal weight. She is not diaphoretic.  HENT:     Head: Normocephalic and atraumatic.  Eyes:     Pupils: Pupils are equal, round, and reactive to light.  Neck:     Thyroid : No thyromegaly.     Vascular: No JVD.     Trachea: No tracheal deviation.  Cardiovascular:     Rate and Rhythm: Normal rate and regular rhythm.     Heart sounds: Normal heart sounds. No murmur heard.    No friction rub. No gallop.  Pulmonary:     Effort: Pulmonary effort is normal. No respiratory distress.     Breath sounds: No wheezing or rales.  Chest:      Chest wall: No tenderness.  Musculoskeletal:        General: Normal range of motion.  Skin:    General: Skin is warm and dry.  Neurological:     Mental Status: She is alert and oriented to person, place, and time.  Psychiatric:        Behavior: Behavior normal.        Thought Content: Thought content normal.        Judgment: Judgment normal.        Assessment/Plan: 1. Type 2 diabetes mellitus  with hyperglycemia, without long-term current use of insulin (HCC) (Primary) Will increase glimepiride  to 1mg  BID aloing with metformin  while improving diet and exercise - metFORMIN  (GLUCOPHAGE ) 1000 MG tablet; Take 1 tablet (1,000 mg total) by mouth 2 (two) times daily with a meal.  Dispense: 180 tablet; Refill: 1 - glimepiride  (AMARYL ) 1 MG tablet; Take 1 tablet by mouth twice daily  Dispense: 60 tablet; Refill: 1  2. Urinary tract infection with hematuria, site unspecified Will send for culture - CULTURE, URINE COMPREHENSIVE  3. Dysuria - POCT Urinalysis Dipstick  4. Mixed hyperlipidemia Tolerating pravastatin  and will update labs - Lipid Panel With LDL/HDL Ratio  5. Vitamin D  deficiency - VITAMIN D  25 Hydroxy (Vit-D Deficiency, Fractures)  6. B12 deficiency - B12 and Folate Panel  7. Abnormal thyroid  exam - TSH + free T4  8. Other fatigue - CBC w/Diff/Platelet - Comprehensive metabolic panel with GFR - TSH + free T4 - Lipid Panel With LDL/HDL Ratio - VITAMIN D  25 Hydroxy (Vit-D Deficiency, Fractures) - B12 and Folate Panel   General Counseling: Rena verbalizes understanding of the findings of todays visit and agrees with plan of treatment. I have discussed any further diagnostic evaluation that may be needed or ordered today. We also reviewed her medications today. she has been encouraged to call the office with any questions or concerns that should arise related to todays visit.    Orders Placed This Encounter  Procedures   CULTURE, URINE COMPREHENSIVE   CBC  w/Diff/Platelet   Comprehensive metabolic panel with GFR   TSH + free T4   Lipid Panel With LDL/HDL Ratio   VITAMIN D  25 Hydroxy (Vit-D Deficiency, Fractures)   B12 and Folate Panel   POCT Urinalysis Dipstick    Meds ordered this encounter  Medications   metFORMIN  (GLUCOPHAGE ) 1000 MG tablet    Sig: Take 1 tablet (1,000 mg total) by mouth 2 (two) times daily with a meal.    Dispense:  180 tablet    Refill:  1    PT REQUESTING REFILLS. THANKS   glimepiride  (AMARYL ) 1 MG tablet    Sig: Take 1 tablet by mouth twice daily    Dispense:  60 tablet    Refill:  1    PT REQUEST REFILLS PLEASE    This patient was seen by Tinnie Pro, PA-C in collaboration with Dr. Sigrid Bathe as a part of collaborative care agreement.   Total time spent:30 Minutes Time spent includes review of chart, medications, test results, and follow up plan with the patient.      Dr Fozia M Khan Internal medicine

## 2024-01-19 ENCOUNTER — Other Ambulatory Visit: Payer: Self-pay

## 2024-01-19 ENCOUNTER — Ambulatory Visit: Payer: Self-pay | Admitting: Physician Assistant

## 2024-01-19 LAB — CULTURE, URINE COMPREHENSIVE

## 2024-01-19 MED ORDER — NITROFURANTOIN MONOHYD MACRO 100 MG PO CAPS: 100.0000 mg | ORAL_CAPSULE | Freq: Two times a day (BID) | ORAL | 0 refills | Status: DC

## 2024-01-19 NOTE — Telephone Encounter (Signed)
-----   Message from Tinnie MARLA Pro sent at 01/19/2024  4:08 PM EDT ----- Please let her know that she does have a UTI and send macrobid  for 10days please ----- Message ----- From: Almer Bi, CMA Sent: 01/14/2024   3:48 PM EDT To: Tinnie MARLA Pro, PA-C

## 2024-01-19 NOTE — Telephone Encounter (Signed)
 Pt notified about UA result and sent Macrobid 

## 2024-01-21 ENCOUNTER — Other Ambulatory Visit: Payer: Self-pay

## 2024-01-21 DIAGNOSIS — E782 Mixed hyperlipidemia: Secondary | ICD-10-CM

## 2024-01-21 MED ORDER — PRAVASTATIN SODIUM 10 MG PO TABS: 10.0000 mg | ORAL_TABLET | Freq: Every day | ORAL | 2 refills | Status: DC

## 2024-01-22 LAB — CBC WITH DIFFERENTIAL/PLATELET
Basophils Absolute: 0.1 x10E3/uL (ref 0.0–0.2)
Basos: 1 %
EOS (ABSOLUTE): 0.1 x10E3/uL (ref 0.0–0.4)
Eos: 2 %
Hematocrit: 40.7 % (ref 34.0–46.6)
Hemoglobin: 13.1 g/dL (ref 11.1–15.9)
Immature Grans (Abs): 0 x10E3/uL (ref 0.0–0.1)
Immature Granulocytes: 0 %
Lymphocytes Absolute: 3.1 x10E3/uL (ref 0.7–3.1)
Lymphs: 40 %
MCH: 26.4 pg — ABNORMAL LOW (ref 26.6–33.0)
MCHC: 32.2 g/dL (ref 31.5–35.7)
MCV: 82 fL (ref 79–97)
Monocytes Absolute: 0.7 x10E3/uL (ref 0.1–0.9)
Monocytes: 9 %
Neutrophils Absolute: 3.7 x10E3/uL (ref 1.4–7.0)
Neutrophils: 48 %
Platelets: 219 x10E3/uL (ref 150–450)
RBC: 4.96 x10E6/uL (ref 3.77–5.28)
RDW: 12.6 % (ref 11.7–15.4)
WBC: 7.6 x10E3/uL (ref 3.4–10.8)

## 2024-01-22 LAB — COMPREHENSIVE METABOLIC PANEL WITH GFR
ALT: 10 IU/L (ref 0–32)
AST: 13 IU/L (ref 0–40)
Albumin: 4 g/dL (ref 3.8–4.9)
Alkaline Phosphatase: 94 IU/L (ref 44–121)
BUN/Creatinine Ratio: 16 (ref 9–23)
BUN: 13 mg/dL (ref 6–24)
Bilirubin Total: 0.7 mg/dL (ref 0.0–1.2)
CO2: 23 mmol/L (ref 20–29)
Calcium: 9.7 mg/dL (ref 8.7–10.2)
Chloride: 100 mmol/L (ref 96–106)
Creatinine, Ser: 0.83 mg/dL (ref 0.57–1.00)
Globulin, Total: 2.9 g/dL (ref 1.5–4.5)
Glucose: 140 mg/dL — ABNORMAL HIGH (ref 70–99)
Potassium: 4.6 mmol/L (ref 3.5–5.2)
Sodium: 136 mmol/L (ref 134–144)
Total Protein: 6.9 g/dL (ref 6.0–8.5)
eGFR: 84 mL/min/1.73 (ref >59)

## 2024-01-22 LAB — LIPID PANEL WITH LDL/HDL RATIO
Cholesterol, Total: 152 mg/dL (ref 100–199)
HDL: 69 mg/dL (ref >39)
LDL Chol Calc (NIH): 67 mg/dL (ref 0–99)
LDL/HDL Ratio: 1 ratio (ref 0.0–3.2)
Triglycerides: 86 mg/dL (ref 0–149)
VLDL Cholesterol Cal: 16 mg/dL (ref 5–40)

## 2024-01-22 LAB — B12 AND FOLATE PANEL
Folate: 9.1 ng/mL (ref >3.0)
Vitamin B-12: 1565 pg/mL — ABNORMAL HIGH (ref 232–1245)

## 2024-01-22 LAB — TSH+FREE T4
Free T4: 1.09 ng/dL (ref 0.82–1.77)
TSH: 2.26 u[IU]/mL (ref 0.450–4.500)

## 2024-01-22 LAB — VITAMIN D 25 HYDROXY (VIT D DEFICIENCY, FRACTURES): Vit D, 25-Hydroxy: 71.9 ng/mL (ref 30.0–100.0)

## 2024-02-04 ENCOUNTER — Encounter: Payer: BLUE CROSS/BLUE SHIELD | Admitting: Physician Assistant

## 2024-02-08 ENCOUNTER — Ambulatory Visit (INDEPENDENT_AMBULATORY_CARE_PROVIDER_SITE_OTHER): Admitting: Physician Assistant

## 2024-02-08 ENCOUNTER — Encounter: Payer: Self-pay | Admitting: Physician Assistant

## 2024-02-08 VITALS — BP 120/70 | HR 79 | Temp 97.8°F | Resp 16 | Ht 61.0 in | Wt 138.0 lb

## 2024-02-08 DIAGNOSIS — Z0001 Encounter for general adult medical examination with abnormal findings: Secondary | ICD-10-CM

## 2024-02-08 DIAGNOSIS — E1165 Type 2 diabetes mellitus with hyperglycemia: Secondary | ICD-10-CM

## 2024-02-08 DIAGNOSIS — Z1211 Encounter for screening for malignant neoplasm of colon: Secondary | ICD-10-CM | POA: Diagnosis not present

## 2024-02-08 DIAGNOSIS — Z1231 Encounter for screening mammogram for malignant neoplasm of breast: Secondary | ICD-10-CM | POA: Diagnosis not present

## 2024-02-08 DIAGNOSIS — Z1212 Encounter for screening for malignant neoplasm of rectum: Secondary | ICD-10-CM

## 2024-02-08 DIAGNOSIS — E782 Mixed hyperlipidemia: Secondary | ICD-10-CM | POA: Diagnosis not present

## 2024-02-08 NOTE — Progress Notes (Signed)
 [facility] [address] [place], [place] [address]  Internal MEDICINE  Office Visit Note  Patient Name: Stacie Flores  [id number]  [id number]  Date of Service: 02/08/2024  Chief Complaint  Patient presents with   Annual Exam     HPI Pt is here for routine health maintenance examination -BG have gone back down once UTI treated, BG in AM now 142 -also taking glimepiride  BID along with metformin  BID, feels well on this dosing -labs reviewed: B12 high will reduce supplement, cholesterol greatly improved and tolerating pravastatin  -declines pap this visit because thinks cycle soon, thinks she is perimenopausal with cycle irregularity and hard to predict. Will often skips months now -eye exam soon, due for mammogram in Oct -would prefer cologuard at this time, denies Fhx of colon CA -foot exam done today  Current Medication: Outpatient Encounter Medications as of 02/08/2024  Medication Sig   Ascorbic Acid (VITAMIN C) 1000 MG tablet Take 1,000 mg by mouth daily.   cholecalciferol  (VITAMIN D3) 25 MCG (1000 UT) tablet Take 1,000 Units by mouth daily.   Cyanocobalamin  (CVS B12 GUMMIES PO) Take by mouth.   Flaxseed, Linseed, (GNP FLAX SEED OIL) 1000 MG CAPS Take by mouth.   glimepiride  (AMARYL ) 1 MG tablet Take 1 tablet by mouth twice daily   glucose blood test strip Use as instructed   Magnesium 100 MG TABS Take by mouth.   metFORMIN  (GLUCOPHAGE ) 1000 MG tablet Take 1 tablet (1,000 mg total) by mouth 2 (two) times daily with a meal.   pravastatin  (PRAVACHOL ) 10 MG tablet Take 1 tablet (10 mg total) by mouth daily.   [DISCONTINUED] nitrofurantoin , macrocrystal-monohydrate, (MACROBID ) 100 MG capsule Take 1 capsule (100 mg total) by mouth 2 (two) times daily.   No facility-administered encounter medications on file as of 02/08/2024.    Surgical History: Past Surgical History:  Procedure Laterality Date   CESAREAN SECTION     LEEP      Medical History: Past Medical  History:  Diagnosis Date   Asthma    Diabetes mellitus without complication (HCC)    Stroke (HCC)     Family History: Family History  Problem Relation Age of Onset   Breast cancer Sister        early 52's   BRCA 1/2 Neg Hx       Review of Systems  Constitutional:  Negative for chills, fatigue and unexpected weight change.  HENT:  Negative for congestion, postnasal drip, rhinorrhea, sneezing and sore throat.   Eyes:  Negative for redness.  Respiratory:  Negative for cough, chest tightness and shortness of breath.   Cardiovascular:  Negative for chest pain and palpitations.  Gastrointestinal:  Negative for abdominal pain, constipation, nausea and vomiting.  Genitourinary:  Negative for dysuria and frequency.  Musculoskeletal:  Negative for arthralgias, back pain, joint swelling and neck pain.  Skin:  Negative for rash.  Neurological: Negative.  Negative for tremors and numbness.  Hematological:  Negative for adenopathy. Does not bruise/bleed easily.  Psychiatric/Behavioral:  Negative for behavioral problems (Depression), sleep disturbance and suicidal ideas. The patient is not nervous/anxious.      Vital Signs: BP 120/70   Pulse 79   Temp 97.8 F (36.6 C)   Resp 16   Ht 5' 1 (1.549 m)   Wt 138 lb (62.6 kg)   SpO2 97%   BMI 26.07 kg/m    Physical Exam Vitals and nursing note reviewed.  Constitutional:      General: She is  not in acute distress.    Appearance: She is well-developed and normal weight. She is not diaphoretic.  HENT:     Head: Normocephalic and atraumatic.  Eyes:     Pupils: Pupils are equal, round, and reactive to light.  Neck:     Thyroid : No thyromegaly.     Vascular: No JVD.     Trachea: No tracheal deviation.  Cardiovascular:     Rate and Rhythm: Normal rate and regular rhythm.     Pulses:          Dorsalis pedis pulses are 3+ on the right side and 3+ on the left side.       Posterior tibial pulses are 3+ on the right side and 3+ on the  left side.     Heart sounds: Normal heart sounds. No murmur heard.    No friction rub. No gallop.  Pulmonary:     Effort: Pulmonary effort is normal. No respiratory distress.     Breath sounds: No wheezing or rales.  Chest:     Chest wall: No tenderness.  Breasts:    Right: Normal. No mass.     Left: Normal. No mass.  Abdominal:     General: Bowel sounds are normal.     Tenderness: There is no abdominal tenderness.  Musculoskeletal:        General: Normal range of motion.     Right foot: Normal range of motion.     Left foot: Normal range of motion.  Feet:     Right foot:     Protective Sensation: 2 sites tested.  2 sites sensed.     Skin integrity: Skin integrity normal.     Toenail Condition: Right toenails are normal.     Left foot:     Protective Sensation: 2 sites tested.  2 sites sensed.     Skin integrity: Skin integrity normal.     Toenail Condition: Left toenails are normal.  Skin:    General: Skin is warm and dry.  Neurological:     Mental Status: She is alert and oriented to person, place, and time.  Psychiatric:        Behavior: Behavior normal.        Thought Content: Thought content normal.        Judgment: Judgment normal.      LABS: Recent Results (from the past 2160 hours)  POCT HgB A1C     Status: Abnormal   Collection Time: 11/12/23  3:10 PM  Result Value Ref Range   Hemoglobin A1C 7.5 (A) 4.0 - 5.6 %   HbA1c POC (<> result, manual entry)     HbA1c, POC (prediabetic range)     HbA1c, POC (controlled diabetic range)    POCT Urinalysis Dipstick     Status: Abnormal   Collection Time: 01/14/24  3:47 PM  Result Value Ref Range   Color, UA     Clarity, UA     Glucose, UA Negative Negative   Bilirubin, UA Negative    Ketones, UA Positive    Spec Grav, UA 1.010 1.010 - 1.025   Blood, UA Positive    pH, UA 5.0 5.0 - 8.0   Protein, UA Positive (A) Negative   Urobilinogen, UA 0.2 0.2 or 1.0 E.U./dL   Nitrite, UA Negative    Leukocytes, UA Trace  (A) Negative   Appearance     Odor    CULTURE, URINE COMPREHENSIVE     Status: Abnormal  Collection Time: 01/14/24  3:47 PM   Specimen: Urine   Urine  Result Value Ref Range   Urine Culture, Comprehensive Final report (A)    Organism ID, Bacteria Escherichia coli (A)     Comment: Cefazolin with an MIC <=16 predicts susceptibility to the oral agents cefaclor, cefdinir, cefpodoxime, cefprozil, cefuroxime, cephalexin, and loracarbef when used for therapy of uncomplicated urinary tract infections due to E. coli, Klebsiella pneumoniae, and Proteus mirabilis. 50,000-100,000 colony forming units per mL    Organism ID, Bacteria Comment     Comment: Mixed urogenital flora 10,000-25,000 colony forming units per mL    ANTIMICROBIAL SUSCEPTIBILITY Comment     Comment:       ** S = Susceptible; I = Intermediate; R = Resistant **                    P = Positive; N = Negative             MICS are expressed in micrograms per mL    Antibiotic                 RSLT#1    RSLT#2    RSLT#3    RSLT#4 Amoxicillin/Clavulanic Acid    S Ampicillin                     S Cefazolin                      S Cefepime                       S Cefoxitin                      S Cefpodoxime                    S Ceftriaxone                    S Ciprofloxacin                  S Ertapenem                      S Gentamicin                     S Levofloxacin                   S Meropenem                      S Nitrofurantoin                  S Piperacillin/Tazobactam        S Tetracycline                   S Tobramycin                     S Trimethoprim/Sulfa             S   CBC w/Diff/Platelet     Status: Abnormal   Collection Time: 01/21/24  8:14 AM  Result Value Ref Range   WBC 7.6 3.4 - 10.8 x10E3/uL   RBC 4.96 3.77 - 5.28 x10E6/uL   Hemoglobin 13.1 11.1 - 15.9 g/dL   Hematocrit 59.2 65.9 - 46.6 %   MCV 82 79 -  97 fL   MCH 26.4 (L) 26.6 - 33.0 pg   MCHC 32.2 31.5 - 35.7 g/dL   RDW 87.3 88.2 - 84.5 %    Platelets 219 150 - 450 x10E3/uL   Neutrophils 48 Not Estab. %   Lymphs 40 Not Estab. %   Monocytes 9 Not Estab. %   Eos 2 Not Estab. %   Basos 1 Not Estab. %   Neutrophils Absolute 3.7 1.4 - 7.0 x10E3/uL   Lymphocytes Absolute 3.1 0.7 - 3.1 x10E3/uL   Monocytes Absolute 0.7 0.1 - 0.9 x10E3/uL   EOS (ABSOLUTE) 0.1 0.0 - 0.4 x10E3/uL   Basophils Absolute 0.1 0.0 - 0.2 x10E3/uL   Immature Granulocytes 0 Not Estab. %   Immature Grans (Abs) 0.0 0.0 - 0.1 x10E3/uL  Comprehensive metabolic panel with GFR     Status: Abnormal   Collection Time: 01/21/24  8:14 AM  Result Value Ref Range   Glucose 140 (H) 70 - 99 mg/dL   BUN 13 6 - 24 mg/dL   Creatinine, Ser 9.16 0.57 - 1.00 mg/dL   eGFR 84 >40 fO/fpw/8.26   BUN/Creatinine Ratio 16 9 - 23   Sodium 136 134 - 144 mmol/L   Potassium 4.6 3.5 - 5.2 mmol/L   Chloride 100 96 - 106 mmol/L   CO2 23 20 - 29 mmol/L   Calcium  9.7 8.7 - 10.2 mg/dL   Total Protein 6.9 6.0 - 8.5 g/dL   Albumin 4.0 3.8 - 4.9 g/dL   Globulin, Total 2.9 1.5 - 4.5 g/dL   Bilirubin Total 0.7 0.0 - 1.2 mg/dL   Alkaline Phosphatase 94 44 - 121 IU/L   AST 13 0 - 40 IU/L   ALT 10 0 - 32 IU/L  TSH + free T4     Status: None   Collection Time: 01/21/24  8:14 AM  Result Value Ref Range   TSH 2.260 0.450 - 4.500 uIU/mL   Free T4 1.09 0.82 - 1.77 ng/dL  Lipid Panel With LDL/HDL Ratio     Status: None   Collection Time: 01/21/24  8:14 AM  Result Value Ref Range   Cholesterol, Total 152 100 - 199 mg/dL   Triglycerides 86 0 - 149 mg/dL   HDL 69 >60 mg/dL   VLDL Cholesterol Cal 16 5 - 40 mg/dL   LDL Chol Calc (NIH) 67 0 - 99 mg/dL   LDL/HDL Ratio 1.0 0.0 - 3.2 ratio    Comment:                                     LDL/HDL Ratio                                             Men  Women                               1/2 Avg.Risk  1.0    1.5                                   Avg.Risk  3.6    3.2  2X Avg.Risk  6.2    5.0                                 3X Avg.Risk  8.0    6.1   VITAMIN D  25 Hydroxy (Vit-D Deficiency, Fractures)     Status: None   Collection Time: 01/21/24  8:14 AM  Result Value Ref Range   Vit D, 25-Hydroxy 71.9 30.0 - 100.0 ng/mL    Comment: Vitamin D  deficiency has been defined by the Institute of Medicine and an Endocrine Society practice guideline as a level of serum 25-OH vitamin D  less than 20 ng/mL (1,2). The Endocrine Society went on to further define vitamin D  insufficiency as a level between 21 and 29 ng/mL (2). 1. IOM (Institute of Medicine). 2010. Dietary reference    intakes for calcium  and D. [place]: The    [employer]. 2. Holick MF, Binkley [place], Bischoff-Ferrari HA, et al.    Evaluation, treatment, and prevention of vitamin D     deficiency: an Endocrine Society clinical practice    guideline. JCEM. 2011 Jul; 96(7):1911-30.   B12 and Folate Panel     Status: Abnormal   Collection Time: 01/21/24  8:14 AM  Result Value Ref Range   Vitamin B-12 1,565 (H) 232 - 1,245 pg/mL   Folate 9.1 >3.0 ng/mL    Comment: A serum folate concentration of less than 3.1 ng/mL is considered to represent clinical deficiency.         Assessment/Plan: 1. Encounter for general adult medical examination with abnormal findings (Primary) CPE performed, labs reviewed, defers pap at this time, will schedule mammogram and colon screening  2. Type 2 diabetes mellitus with hyperglycemia, without long-term current use of insulin (HCC) BG improving, continue current medications and monitoring  3. Visit for screening mammogram - MM 3D SCREENING MAMMOGRAM BILATERAL BREAST; Future  4. Screening for colorectal cancer - Cologuard  5. Mixed hyperlipidemia Improving, continue pravastatin    General Counseling: Adalind verbalizes understanding of the findings of todays visit and agrees with plan of treatment. I have discussed any further diagnostic evaluation that may be needed or ordered today. We also reviewed  her medications today. she has been encouraged to call the office with any questions or concerns that should arise related to todays visit.    Counseling:    Orders Placed This Encounter  Procedures   MM 3D SCREENING MAMMOGRAM BILATERAL BREAST   Cologuard    No orders of the defined types were placed in this encounter.   This patient was seen by Tinnie Pro, PA-C in collaboration with Dr. Sigrid Bathe as a part of collaborative care agreement.  Total time spent:35 Minutes  Time spent includes review of chart, medications, test results, and follow up plan with the patient.     Sigrid CHRISTELLA Bathe, MD  Internal Medicine

## 2024-03-10 ENCOUNTER — Encounter: Payer: Self-pay | Admitting: Physician Assistant

## 2024-03-10 ENCOUNTER — Ambulatory Visit (INDEPENDENT_AMBULATORY_CARE_PROVIDER_SITE_OTHER): Admitting: Physician Assistant

## 2024-03-10 VITALS — BP 130/82 | HR 89 | Temp 97.7°F | Resp 16 | Ht 61.0 in | Wt 136.0 lb

## 2024-03-10 DIAGNOSIS — Z114 Encounter for screening for human immunodeficiency virus [HIV]: Secondary | ICD-10-CM | POA: Diagnosis not present

## 2024-03-10 DIAGNOSIS — Z124 Encounter for screening for malignant neoplasm of cervix: Secondary | ICD-10-CM

## 2024-03-10 DIAGNOSIS — E1165 Type 2 diabetes mellitus with hyperglycemia: Secondary | ICD-10-CM

## 2024-03-10 DIAGNOSIS — Z113 Encounter for screening for infections with a predominantly sexual mode of transmission: Secondary | ICD-10-CM

## 2024-03-10 LAB — POCT GLYCOSYLATED HEMOGLOBIN (HGB A1C): Hemoglobin A1C: 6.7 % — AB (ref 4.0–5.6)

## 2024-03-10 NOTE — Progress Notes (Signed)
 [facility] [address] [place], [place] [address]  Internal MEDICINE  Office Visit Note  Patient Name: Stacie Flores  [id number]  [id number]  Date of Service: 03/10/2024  Chief Complaint  Patient presents with   Follow-up   Diabetes    HPI Pt is here for routine follow up and to complete pap -Sugars doing better -walking more and tolerating meds well -would like to move forward with HIV screening -mammogram at [facility] on 10/29 -will be doing cologuard  Current Medication: Outpatient Encounter Medications as of 03/10/2024  Medication Sig   Ascorbic Acid (VITAMIN C) 1000 MG tablet Take 1,000 mg by mouth daily.   cholecalciferol  (VITAMIN D3) 25 MCG (1000 UT) tablet Take 1,000 Units by mouth daily.   Cyanocobalamin  (CVS B12 GUMMIES PO) Take by mouth.   Flaxseed, Linseed, (GNP FLAX SEED OIL) 1000 MG CAPS Take by mouth.   glucose blood test strip Use as instructed   Magnesium 100 MG TABS Take by mouth.   metFORMIN  (GLUCOPHAGE ) 1000 MG tablet Take 1 tablet (1,000 mg total) by mouth 2 (two) times daily with a meal.   pravastatin  (PRAVACHOL ) 10 MG tablet Take 1 tablet (10 mg total) by mouth daily.   [DISCONTINUED] glimepiride  (AMARYL ) 1 MG tablet Take 1 tablet by mouth twice daily   No facility-administered encounter medications on file as of 03/10/2024.    Surgical History: Past Surgical History:  Procedure Laterality Date   CESAREAN SECTION     LEEP      Medical History: Past Medical History:  Diagnosis Date   Asthma    Diabetes mellitus without complication (HCC)    Stroke (HCC)     Family History: Family History  Problem Relation Age of Onset   Breast cancer Sister        early 34's   BRCA 1/2 Neg Hx     Social History   Socioeconomic History   Marital status: Single    Spouse name: Not on file   Number of children: Not on file   Years of education: Not on file   Highest education level: Not on file  Occupational History   Not on file   Tobacco Use   Smoking status: Never   Smokeless tobacco: Never  Substance and Sexual Activity   Alcohol use: No   Drug use: No   Sexual activity: Yes  Other Topics Concern   Not on file  Social History Narrative   Live with family   Social Drivers of Health   Financial Resource Strain: Low Risk  (04/09/2018)   Overall Financial Resource Strain (CARDIA)    Difficulty of Paying Living Expenses: Not hard at all  Food Insecurity: No Food Insecurity (04/09/2018)   Hunger Vital Sign    Worried About Running Out of Food in the Last Year: Never true    Ran Out of Food in the Last Year: Never true  Transportation Needs: No Transportation Needs (04/09/2018)   PRAPARE - Administrator, Civil Service (Medical): No    Lack of Transportation (Non-Medical): No  Physical Activity: Insufficiently Active (04/09/2018)   Exercise Vital Sign    Days of Exercise per Week: 5 days    Minutes of Exercise per Session: 20 min  Stress: No Stress Concern Present (04/09/2018)   [employer] of Occupational Health - Occupational Stress Questionnaire    Feeling of Stress : Not at all  Social Connections: Somewhat Isolated (04/09/2018)   Social Connection and Isolation Panel  Frequency of Communication with Friends and Family: Twice a week    Frequency of Social Gatherings with Friends and Family: Twice a week    Attends Religious Services: 1 to 4 times per year    Active Member of [employer] or Organizations: No    Attends Banker Meetings: Never    Marital Status: Divorced  Catering Manager Violence: Not At Risk (04/09/2018)   Humiliation, Afraid, Rape, and Kick questionnaire    Fear of Current or Ex-Partner: No    Emotionally Abused: No    Physically Abused: No    Sexually Abused: No      Review of Systems  Constitutional:  Negative for chills, fatigue and unexpected weight change.  HENT:  Negative for congestion, postnasal drip, rhinorrhea, sneezing and sore throat.    Eyes:  Negative for redness.  Respiratory:  Negative for cough, chest tightness and shortness of breath.   Cardiovascular:  Negative for chest pain and palpitations.  Gastrointestinal:  Negative for abdominal pain, constipation, nausea and vomiting.  Genitourinary:  Negative for dysuria and frequency.  Musculoskeletal:  Negative for arthralgias, back pain, joint swelling and neck pain.  Skin:  Negative for rash.  Neurological: Negative.  Negative for tremors and numbness.  Hematological:  Negative for adenopathy. Does not bruise/bleed easily.  Psychiatric/Behavioral:  Negative for behavioral problems (Depression), sleep disturbance and suicidal ideas. The patient is not nervous/anxious.     Vital Signs: BP 130/82   Pulse 89   Temp 97.7 F (36.5 C)   Resp 16   Ht 5' 1 (1.549 m)   Wt 136 lb (61.7 kg)   SpO2 96%   BMI 25.70 kg/m    Physical Exam Vitals and nursing note reviewed.  Constitutional:      General: She is not in acute distress.    Appearance: She is well-developed and normal weight. She is not diaphoretic.  HENT:     Head: Normocephalic and atraumatic.  Eyes:     Pupils: Pupils are equal, round, and reactive to light.  Neck:     Thyroid : No thyromegaly.     Vascular: No JVD.     Trachea: No tracheal deviation.  Cardiovascular:     Rate and Rhythm: Normal rate and regular rhythm.     Heart sounds: Normal heart sounds. No murmur heard.    No friction rub. No gallop.  Pulmonary:     Effort: Pulmonary effort is normal. No respiratory distress.     Breath sounds: No wheezing or rales.  Chest:     Chest wall: No tenderness.  Genitourinary:    Exam position: Lithotomy position.     Comments: Pap performed Musculoskeletal:        General: Normal range of motion.  Skin:    General: Skin is warm and dry.  Neurological:     Mental Status: She is alert and oriented to person, place, and time.  Psychiatric:        Behavior: Behavior normal.        Thought  Content: Thought content normal.        Judgment: Judgment normal.        Assessment/Plan: 1. Type 2 diabetes mellitus with hyperglycemia, without long-term current use of insulin (HCC) (Primary) - POCT HgB A1C is 6.7 which is improved from 7.5 last visit and is now well controlled. Continue current medications and working on diet and exercise  2. Routine cervical smear - IGP, Aptima HPV  3. Screening for STDs (sexually  transmitted diseases) - NuSwab Vaginitis Plus (VG+)  4. Encounter for screening for HIV - HIV antibody (with reflex)   General Counseling: Vannie verbalizes understanding of the findings of todays visit and agrees with plan of treatment. I have discussed any further diagnostic evaluation that may be needed or ordered today. We also reviewed her medications today. she has been encouraged to call the office with any questions or concerns that should arise related to todays visit.    Orders Placed This Encounter  Procedures   NuSwab Vaginitis Plus (VG+)   HIV antibody (with reflex)   POCT HgB A1C    No orders of the defined types were placed in this encounter.   This patient was seen by Tinnie Pro, PA-C in collaboration with Dr. Sigrid Bathe as a part of collaborative care agreement.   Total time spent:30 Minutes Time spent includes review of chart, medications, test results, and follow up plan with the patient.      Dr Fozia M Khan Internal medicine

## 2024-03-12 LAB — NUSWAB VAGINITIS PLUS (VG+)
Candida albicans, NAA: NEGATIVE
Candida glabrata, NAA: NEGATIVE
Chlamydia trachomatis, NAA: NEGATIVE
Neisseria gonorrhoeae, NAA: NEGATIVE
Trich vag by NAA: NEGATIVE

## 2024-03-13 LAB — IGP, APTIMA HPV: HPV Aptima: NEGATIVE

## 2024-03-22 ENCOUNTER — Other Ambulatory Visit: Payer: Self-pay | Admitting: Physician Assistant

## 2024-03-22 DIAGNOSIS — E1165 Type 2 diabetes mellitus with hyperglycemia: Secondary | ICD-10-CM

## 2024-04-05 ENCOUNTER — Ambulatory Visit: Payer: Self-pay | Admitting: Physician Assistant

## 2024-04-19 ENCOUNTER — Other Ambulatory Visit: Payer: Self-pay

## 2024-04-19 ENCOUNTER — Encounter: Payer: Self-pay | Admitting: Medical

## 2024-04-19 ENCOUNTER — Ambulatory Visit (INDEPENDENT_AMBULATORY_CARE_PROVIDER_SITE_OTHER): Payer: Self-pay | Admitting: Medical

## 2024-04-19 VITALS — BP 100/80 | HR 100 | Temp 97.4°F | Ht 61.0 in | Wt 128.0 lb

## 2024-04-19 DIAGNOSIS — R1013 Epigastric pain: Secondary | ICD-10-CM

## 2024-04-19 DIAGNOSIS — E1165 Type 2 diabetes mellitus with hyperglycemia: Secondary | ICD-10-CM

## 2024-04-19 DIAGNOSIS — R630 Anorexia: Secondary | ICD-10-CM

## 2024-04-19 LAB — GLUCOSE, POCT (MANUAL RESULT ENTRY): POC Glucose: 213 mg/dL — AB (ref 70–99)

## 2024-04-19 NOTE — Progress Notes (Signed)
 [facility] [address] [place], [place] [address]   Office Visit Note  Patient Name: Stacie Flores Date of Birth [id number]  Medical Record number [id number]  Date of Service: 04/19/2024  Chief Complaint  Patient presents with   Acute Visit    Patient states she has been having abdominal pain for the last 4 days. It feels like an aching pain that builds up. I can feel it moving. She thought it may be gas so she took Pepto Bismol yesterday and today and drank ginger tea. She states she feels weak because she has not been eating due to the pain. She states her stool was black this morning but she thought it may be from taking ibuprofen  a few days ago.     HPI Discussed the use of AI scribe software for clinical note transcription with the patient, who gave verbal consent to proceed.  History of Present Illness Stacie Flores is a 53 year old female with type 2 diabetes who presents with upper abdominal pain and weakness.  Upper abdominal pain and gastrointestinal symptoms - Onset Saturday (3 days ago), occurring daily but intermittently - Pain described as a 'pulling a rubber band' sensation, very uncomfortable, rated 6.5 to 7 out of 10 at worst - Pain located in the upper stomach area - Pain recurs every 30 to 45 minutes, eases off between episodes - Associated with bloating and passing gas - No relation to eating or drinking; has been limiting food intake due to discomfort - A little nausea accompanying pain, no vomiting - No reflux, heartburn, or diarrhea - No chest pain or shortness of breath except during pain episodes - Black stools present last couple days, attributed to Pepto Bismol use; No blood in stool. No black stool prior to use of Pepto Bismol. - No relief with ginger tea or nearly a whole bottle of Pepto Bismol - Denies frequent/heavy use of NSAIDs  Weakness and reduced oral intake - Feeling somewhat weak today, attributed to decreased food  intake; Also returned to work today after several days away - Minimal food consumption: crackers with cashew butter and grilled chicken salad - Reduced oral intake to avoid provoking sx - Has continued taking diabetes meds (Metformin  and Glimepiride ) despite decreased oral intake. -Recalls eating air-fried tater tots mixed with onion night before sx began, thinks this could have contributed to sx.   Headache - Headache (Described as migraine by patient) occurred on Saturday - Resolved with ibuprofen   Diabetes management - Dx with T2DM 02/2019. Most recent HgB A1C 6.7 03/10/24. - Diabetes medication: metformin , glimepiride  - Blood sugar 103 mg/dL this morning - Has not been checking blood sugar regularly over past few days due to reduced food intake - Increased consumption of cashew butter over past few weeks  Denies personal or family hx of heart disease. Pt with hx of possible minor stroke/TIA 6 years ago with no residual effects.   Current Medication:  Outpatient Encounter Medications as of 04/19/2024  Medication Sig   glimepiride  (AMARYL ) 1 MG tablet TAKE 1 TABLET BY MOUTH TWICE DAILY.   glucose blood test strip Use as instructed   metFORMIN  (GLUCOPHAGE ) 1000 MG tablet Take 1 tablet (1,000 mg total) by mouth 2 (two) times daily with a meal.   pravastatin  (PRAVACHOL ) 10 MG tablet Take 1 tablet (10 mg total) by mouth daily.   cholecalciferol  (VITAMIN D3) 25 MCG (1000 UT) tablet Take 1,000 Units by mouth daily. (Patient not taking: Reported  on 04/19/2024)   [DISCONTINUED] Ascorbic Acid (VITAMIN C) 1000 MG tablet Take 1,000 mg by mouth daily.   [DISCONTINUED] Cyanocobalamin  (CVS B12 GUMMIES PO) Take by mouth.   [DISCONTINUED] Flaxseed, Linseed, (GNP FLAX SEED OIL) 1000 MG CAPS Take by mouth.   [DISCONTINUED] Magnesium 100 MG TABS Take by mouth.   No facility-administered encounter medications on file as of 04/19/2024.      Medical History: Past Medical History:  Diagnosis Date    Asthma    Diabetes mellitus without complication (HCC)    Stroke (HCC)      Vital Signs: BP 100/80   Pulse 100   Temp (!) 97.4 F (36.3 C)   Ht 5' 1 (1.549 m)   Wt 128 lb (58.1 kg)   SpO2 97%   BMI 24.19 kg/m    Review of Systems  Constitutional:  Positive for appetite change and fatigue. Negative for chills, diaphoresis and fever.  Eyes:  Negative for visual disturbance.  Respiratory:  Negative for cough, chest tightness and shortness of breath.   Cardiovascular:  Negative for chest pain, palpitations and leg swelling.  Gastrointestinal:  Positive for abdominal pain and nausea. Negative for blood in stool, constipation, diarrhea and vomiting.  Genitourinary:  Negative for dysuria, frequency and pelvic pain.  Musculoskeletal:  Negative for neck pain and neck stiffness.  Neurological:  Positive for headaches (Single episode 3 days ago, not since).    Physical Exam Vitals reviewed.  Constitutional:      General: She is not in acute distress.    Appearance: She is not ill-appearing.     Comments: Mildly uncomfortable appearing  HENT:     Head: Normocephalic.     Mouth/Throat:     Mouth: Mucous membranes are moist.     Pharynx: Oropharynx is clear. No pharyngeal swelling or posterior oropharyngeal erythema.  Cardiovascular:     Rate and Rhythm: Normal rate and regular rhythm.     Heart sounds: No murmur heard.    No friction rub. No gallop.  Pulmonary:     Effort: Pulmonary effort is normal. No respiratory distress.     Breath sounds: Normal breath sounds. No wheezing, rhonchi or rales.  Abdominal:     General: Bowel sounds are decreased. There is no distension.     Palpations: Abdomen is soft.     Tenderness: There is no abdominal tenderness. There is no right CVA tenderness, left CVA tenderness or guarding.     Comments: Bowel sounds present but somewhat decreased in all 4 quadrants.  Musculoskeletal:     Cervical back: Neck supple. No rigidity.     Right lower  leg: No edema.     Left lower leg: No edema.  Skin:    General: Skin is warm and dry.     Capillary Refill: Capillary refill takes less than 2 seconds.  Neurological:     General: No focal deficit present.     Mental Status: She is alert.     Cranial Nerves: No dysarthria or facial asymmetry.     Gait: Gait normal.     Results for orders placed or performed in visit on 04/19/24 (from the past 24 hours)  POCT Glucose (CBG)     Status: Abnormal   Collection Time: 04/19/24 11:05 AM  Result Value Ref Range   POC Glucose 213 (A) 70 - 99 mg/dl     Assessment/Plan: 1. Epigastric pain (Primary) 2. Lack of appetite 3. Type 2 diabetes mellitus with hyperglycemia, without long-term  current use of insulin (HCC)  - POCT Glucose (CBG)  Assessment & Plan Epigastric pain with bloating and intermittent nausea Intermittent epigastric pain with nausea, likely due to gastritis or gas buildup. Black stools likely from Pepto Bismol. No reflux or diarrhea. Patient did note some temporary relief of discomfort after burping while in clinic (helped with ginger ale). - Recommended Gas-X for gas relief. - Recommended Pepcid for stomach acid reduction. - Advised peppermint tea for gas relief. - Advised avoiding cashew butter temporarily. - Advised maintaining a bland diet, avoiding fatty, spicy, and acidic foods. - Advised monitoring for worsening symptoms such as increased abdominal pain, chest pain, shortness of breath, or dizziness, and to seek emergency care if these occur.  Weakness and decreased oral intake Weakness perhaps due to reduced food intake and possible mild dehydration. Has been taking diabetes meds regularly despite significantly reduced oral intake. Not hypoglycemic today - had eaten sandwich and some ginger ale prior to us  checking blood sugar.  - Encouraged increased fluid intake to prevent dehydration. - Advised maintaining regular blood sugar monitoring. - Recommended small,  frequent meals to maintain blood sugar levels.  Type 2 diabetes mellitus, controlled Potential hypoglycemia concern due to decreased intake. - Continue current diabetes management. - Monitor blood sugar levels regularly, especially with decreased oral intake. - Encouraged consumption of small, frequent meals to maintain blood sugar levels.   General Counseling: Tammye verbalizes understanding of the findings of todays visit and plan of treatment. she has been encouraged to call the office with any questions or concerns that should arise related to todays visit.    Time spent:30 Minutes    Joen Arts PA-C Physician Assistant

## 2024-04-19 NOTE — Patient Instructions (Signed)
 Your upper abdominal pain and nausea are likely due to gastritis or gas buildup. The black stools are probably from the Pepto Bismol you took. To help with your symptoms, you should take Gas-X for gas relief and Pepcid to reduce stomach acid. Take these as recommended on the packaging. Drinking peppermint tea may also help with gas. Avoid eating cashew butter for now and stick to a bland diet, avoiding fatty, spicy, and acidic foods. If you experience worsening symptoms like chest pain, shortness of breath, or dizziness, seek emergency care immediately.  Your weakness is likely due to not eating enough and possibly being dehydrated.  With your reduced food intake and diabetes medicines, there is a risk of low blood sugar. Make sure to drink more fluids to stay hydrated and try to eat small, frequent meals to keep your blood sugar levels stable. Continue to monitor your blood sugar regularly.  Please follow up with your doctor or with [facility] if your symptoms do not improve over the next 48 hours or if you experience any new or worsening symptoms (i.e. vomiting, diarrhea, fever). Continue to monitor your blood sugar levels regularly and maintain a bland diet as advised. If you experience any severe symptoms (i.e. increased abdominal pain, chest pain, shortness of breath, increased weakness or dizziness), seek emergency care immediately.                 Contains text generated by [employer]bridge.

## 2024-05-31 ENCOUNTER — Other Ambulatory Visit: Payer: Self-pay | Admitting: Physician Assistant

## 2024-05-31 DIAGNOSIS — E782 Mixed hyperlipidemia: Secondary | ICD-10-CM

## 2024-05-31 DIAGNOSIS — E1165 Type 2 diabetes mellitus with hyperglycemia: Secondary | ICD-10-CM

## 2024-06-13 ENCOUNTER — Encounter: Payer: Self-pay | Admitting: Physician Assistant

## 2024-06-13 ENCOUNTER — Ambulatory Visit: Admitting: Physician Assistant

## 2024-06-13 VITALS — BP 110/78 | HR 88 | Temp 98.0°F | Resp 16 | Ht 61.0 in | Wt 139.4 lb

## 2024-06-13 DIAGNOSIS — E782 Mixed hyperlipidemia: Secondary | ICD-10-CM | POA: Diagnosis not present

## 2024-06-13 DIAGNOSIS — E1165 Type 2 diabetes mellitus with hyperglycemia: Secondary | ICD-10-CM

## 2024-06-13 LAB — POCT GLYCOSYLATED HEMOGLOBIN (HGB A1C): Hemoglobin A1C: 7 % — AB (ref 4.0–5.6)

## 2024-06-13 MED ORDER — PRAVASTATIN SODIUM 10 MG PO TABS: 10.0000 mg | ORAL_TABLET | Freq: Every day | ORAL | 2 refills | Status: AC

## 2024-06-13 MED ORDER — GLIMEPIRIDE 1 MG PO TABS: ORAL_TABLET | ORAL | 5 refills | Status: AC

## 2024-06-13 NOTE — Progress Notes (Unsigned)
 [facility] [address] [place], [place] [address]  Internal MEDICINE  Office Visit Note  Patient Name: Stacie Flores  [id number]  [id number]  Date of Service: 06/13/2024  Chief Complaint  Patient presents with   Follow-up   Diabetes   Quality Metric Gaps    Colonoscopy, Eye Exam, TDAP    HPI Pt is here for routine follow up -Feeling well on current meds. Taking glimepiride  BID and not getting lows -up 3lbs since last visit, but attributes this to more layers -Admits to not walking as much with colder weather -Am sugars doing well -will do cologuard, will schedule eye exam and mammogram  Current Medication: Outpatient Encounter Medications as of 06/13/2024  Medication Sig   glucose blood test strip Use as instructed   metFORMIN  (GLUCOPHAGE ) 1000 MG tablet Take 1 tablet (1,000 mg total) by mouth 2 (two) times daily with a meal.   [DISCONTINUED] glimepiride  (AMARYL ) 1 MG tablet TAKE ONE TABLET BY MOUTH ONCE DAILY WITHBREAKFAST   [DISCONTINUED] pravastatin  (PRAVACHOL ) 10 MG tablet TAKE ONE TABLET BY MOUTH ONCE DAILY   cholecalciferol  (VITAMIN D3) 25 MCG (1000 UT) tablet Take 1,000 Units by mouth daily. (Patient not taking: Reported on 06/13/2024)   glimepiride  (AMARYL ) 1 MG tablet Take 1 tablet by mouth BID   pravastatin  (PRAVACHOL ) 10 MG tablet Take 1 tablet (10 mg total) by mouth daily.   No facility-administered encounter medications on file as of 06/13/2024.    Surgical History: Past Surgical History:  Procedure Laterality Date   CESAREAN SECTION     LEEP      Medical History: Past Medical History:  Diagnosis Date   Asthma    Diabetes mellitus without complication (HCC)    Stroke (HCC)     Family History: Family History  Problem Relation Age of Onset   Breast cancer Sister        early 52's   BRCA 1/2 Neg Hx     Social History   Socioeconomic History   Marital status: Single    Spouse name: Not on file   Number of children: Not on file    Years of education: Not on file   Highest education level: Not on file  Occupational History   Not on file  Tobacco Use   Smoking status: Never   Smokeless tobacco: Never  Vaping Use   Vaping status: Never Used  Substance and Sexual Activity   Alcohol use: No   Drug use: No   Sexual activity: Yes  Other Topics Concern   Not on file  Social History Narrative   Live with family   Social Drivers of Health   Tobacco Use: Low Risk (06/13/2024)   Patient History    Smoking Tobacco Use: Never    Smokeless Tobacco Use: Never    Passive Exposure: Not on file  Financial Resource Strain: Not on file  Food Insecurity: Not on file  Transportation Needs: Not on file  Physical Activity: Not on file  Stress: Not on file  Social Connections: Not on file  Intimate Partner Violence: Not on file  Depression (PHQ2-9): Low Risk (06/13/2024)   Depression (PHQ2-9)    PHQ-2 Score: 0  Alcohol Screen: Low Risk (07/01/2021)   Alcohol Screen    Last Alcohol Screening Score (AUDIT): 0  Housing: Not on file  Utilities: Not on file  Health Literacy: Not on file      Review of Systems  Constitutional:  Negative for chills, fatigue and unexpected weight  change.  HENT:  Negative for congestion, postnasal drip, rhinorrhea, sneezing and sore throat.   Eyes:  Negative for redness.  Respiratory:  Negative for cough, chest tightness and shortness of breath.   Cardiovascular:  Negative for chest pain and palpitations.  Gastrointestinal:  Negative for abdominal pain, constipation, nausea and vomiting.  Genitourinary:  Negative for dysuria and frequency.  Musculoskeletal:  Negative for arthralgias, back pain, joint swelling and neck pain.  Skin:  Negative for rash.  Neurological: Negative.  Negative for tremors and numbness.  Hematological:  Negative for adenopathy. Does not bruise/bleed easily.  Psychiatric/Behavioral:  Negative for behavioral problems (Depression), sleep disturbance and suicidal ideas.  The patient is not nervous/anxious.     Vital Signs: BP 110/78   Pulse 88   Temp 98 F (36.7 C)   Resp 16   Ht 5' 1 (1.549 m)   Wt 139 lb 6.4 oz (63.2 kg)   SpO2 98%   BMI 26.34 kg/m    Physical Exam Vitals and nursing note reviewed.  Constitutional:      General: She is not in acute distress.    Appearance: She is well-developed and normal weight. She is not diaphoretic.  HENT:     Head: Normocephalic and atraumatic.  Eyes:     Extraocular Movements: Extraocular movements intact.  Neck:     Thyroid : No thyromegaly.     Vascular: No JVD.     Trachea: No tracheal deviation.  Cardiovascular:     Rate and Rhythm: Normal rate and regular rhythm.     Heart sounds: Normal heart sounds. No murmur heard.    No friction rub. No gallop.  Pulmonary:     Effort: Pulmonary effort is normal. No respiratory distress.     Breath sounds: No wheezing or rales.  Chest:     Chest wall: No tenderness.  Musculoskeletal:        General: Normal range of motion.  Skin:    General: Skin is warm and dry.  Neurological:     Mental Status: She is alert and oriented to person, place, and time.  Psychiatric:        Behavior: Behavior normal.        Thought Content: Thought content normal.        Judgment: Judgment normal.        Assessment/Plan: 1. Type 2 diabetes mellitus with hyperglycemia, without long-term current use of insulin (HCC) (Primary) - POCT HgB A1C is 7.0 which is up from 6.7, but at threshold of being controlled. Will continue current medications and patient will work on diet and exercise - Microalbumin, urine - glimepiride  (AMARYL ) 1 MG tablet; Take 1 tablet by mouth BID  Dispense: 30 tablet; Refill: 5  2. Mixed hyperlipidemia Continue pravastatin  - pravastatin  (PRAVACHOL ) 10 MG tablet; Take 1 tablet (10 mg total) by mouth daily.  Dispense: 30 tablet; Refill: 2   General Counseling: Stacie Flores verbalizes understanding of the findings of todays visit and agrees with plan  of treatment. I have discussed any further diagnostic evaluation that may be needed or ordered today. We also reviewed her medications today. she has been encouraged to call the office with any questions or concerns that should arise related to todays visit.    Orders Placed This Encounter  Procedures   Microalbumin, urine   POCT HgB A1C    Meds ordered this encounter  Medications   glimepiride  (AMARYL ) 1 MG tablet    Sig: Take 1 tablet by mouth BID  Dispense:  30 tablet    Refill:  5    FOR NEXT FILL   pravastatin  (PRAVACHOL ) 10 MG tablet    Sig: Take 1 tablet (10 mg total) by mouth daily.    Dispense:  30 tablet    Refill:  2    PT IS REQUESTING REFILL    This patient was seen by Tinnie Pro, PA-C in collaboration with Dr. Sigrid Bathe as a part of collaborative care agreement.   Total time spent:30 Minutes Time spent includes review of chart, medications, test results, and follow up plan with the patient.      Dr Fozia M Khan Internal medicine

## 2024-06-14 LAB — MICROALBUMIN, URINE: Microalbumin, Urine: 4.6 ug/mL

## 2024-06-23 ENCOUNTER — Other Ambulatory Visit: Payer: Self-pay

## 2024-06-23 ENCOUNTER — Encounter: Payer: Self-pay | Admitting: Adult Health

## 2024-06-23 ENCOUNTER — Ambulatory Visit (INDEPENDENT_AMBULATORY_CARE_PROVIDER_SITE_OTHER): Payer: Self-pay | Admitting: Adult Health

## 2024-06-23 VITALS — BP 110/70 | HR 105 | Temp 98.2°F | Ht 61.0 in | Wt 138.0 lb

## 2024-06-23 DIAGNOSIS — J0110 Acute frontal sinusitis, unspecified: Secondary | ICD-10-CM

## 2024-06-23 DIAGNOSIS — R0981 Nasal congestion: Secondary | ICD-10-CM

## 2024-06-23 LAB — POC SOFIA 2 FLU + SARS ANTIGEN FIA
Influenza A, POC: NEGATIVE
Influenza B, POC: NEGATIVE
SARS Coronavirus 2 Ag: NEGATIVE

## 2024-06-23 MED ORDER — DOXYCYCLINE HYCLATE 100 MG PO TABS: 100.0000 mg | ORAL_TABLET | Freq: Two times a day (BID) | ORAL | 0 refills | Status: AC

## 2024-06-23 NOTE — Progress Notes (Signed)
 Therapist, [facility] [address] [place], [place] [address]   Office Visit Note  Patient Name: Stacie Flores Date of Birth [id number]  Medical Record number [id number]  Date of Service: 06/23/2024  Chief Complaint  Patient presents with   Acute Visit    Patient c/o weakness, productive cough, and sore throat. She has been having difficulty taking a deep breath. Denies wheezing and SOB. Symptoms began 2 days ago. She has been taking Mucinex and Tylenol  which has been helpful. Patient reports a hx of asthma but she does not use inhalers.     HPI Pt is here for a sick visit. She reports 2 days ago she started having weakness, excessive mucous, and sore throat.  She started taking mucinex and tylenol .  She describes the mucous as chunky and green. Denies body aches, fever, chills.   Current Medication:  Outpatient Encounter Medications as of 06/23/2024  Medication Sig   cholecalciferol  (VITAMIN D3) 25 MCG (1000 UT) tablet Take 1,000 Units by mouth daily.   doxycycline  (VIBRA -TABS) 100 MG tablet Take 1 tablet (100 mg total) by mouth 2 (two) times daily.   glimepiride  (AMARYL ) 1 MG tablet Take 1 tablet by mouth BID   glucose blood test strip Use as instructed   metFORMIN  (GLUCOPHAGE ) 1000 MG tablet Take 1 tablet (1,000 mg total) by mouth 2 (two) times daily with a meal.   pravastatin  (PRAVACHOL ) 10 MG tablet Take 1 tablet (10 mg total) by mouth daily.   No facility-administered encounter medications on file as of 06/23/2024.      Medical History: Past Medical History:  Diagnosis Date   Asthma    Diabetes mellitus without complication (HCC)    Stroke (HCC)      Vital Signs: BP 110/70   Pulse (!) 105   Temp 98.2 F (36.8 C)   Ht 5' 1 (1.549 m)   Wt 138 lb (62.6 kg)   SpO2 97%   BMI 26.07 kg/m    Review of Systems  Constitutional:  Positive for fatigue. Negative for chills and fever.  HENT:  Positive for rhinorrhea and sore throat.   Eyes:  Negative for pain and  itching.  Respiratory:  Positive for cough.   Cardiovascular:  Negative for chest pain.  Gastrointestinal:  Negative for diarrhea, nausea and vomiting.  Musculoskeletal:  Negative for myalgias.    Physical Exam Vitals reviewed.  Constitutional:      General: She is not in acute distress.    Appearance: She is normal weight. She is not ill-appearing.  HENT:     Head: Normocephalic.     Right Ear: Tympanic membrane and ear canal normal.     Left Ear: Tympanic membrane and ear canal normal.     Nose: Nose normal.     Mouth/Throat:     Mouth: Mucous membranes are moist.  Eyes:     General:        Right eye: No discharge.        Left eye: No discharge.     Pupils: Pupils are equal, round, and reactive to light.  Cardiovascular:     Rate and Rhythm: Normal rate and regular rhythm.     Pulses: Normal pulses.     Heart sounds: No murmur heard.    No friction rub.  Pulmonary:     Effort: Pulmonary effort is normal. No respiratory distress.     Breath sounds: Normal breath sounds. No wheezing.  Musculoskeletal:     Cervical  back: Normal range of motion. No rigidity.  Lymphadenopathy:     Cervical: No cervical adenopathy.  Neurological:     General: No focal deficit present.     Mental Status: She is alert and oriented to person, place, and time.  Psychiatric:        Mood and Affect: Mood normal.        Behavior: Behavior normal.    Assessment/Plan: 1. Acute non-recurrent frontal sinusitis (Primary) Patient Instructions: -Take complete course of antibiotics as prescribed.  Take with food.  -Try Flonase/Fluticasone nasal spray, 2 sprays to each nostril once a day. -You can try using a neti pot or nasal saline rinse product to help clear mucus congestion. -Rest and stay well hydrated (by drinking water and other liquids). Avoid/limit caffeine. -Take over-the-counter medicines (i.e. Mucinex, decongestant, Ibuprofen  or Tylenol , cough suppressant) to help relieve your  symptoms. -For your cough, use cough drops/throat lozenges, gargle warm salt water and/or drink warm liquids (like tea with honey). -Send my chart message to provider or schedule return visit as needed for new/worsening symptoms or if symptoms do not improve as discussed with antibiotic and other recommended treatment.   - doxycycline  (VIBRA -TABS) 100 MG tablet; Take 1 tablet (100 mg total) by mouth 2 (two) times daily.  Dispense: 20 tablet; Refill: 0  2. Sinus congestion - POC SOFIA 2 FLU + SARS ANTIGEN FIA     General Counseling: Emylee verbalizes understanding of the findings of todays visit and agrees with plan of treatment. I have discussed any further diagnostic evaluation that may be needed or ordered today. We also reviewed her medications today. she has been encouraged to call the office with any questions or concerns that should arise related to todays visit.   Orders Placed This Encounter  Procedures   POC SOFIA 2 FLU + SARS ANTIGEN FIA    Meds ordered this encounter  Medications   doxycycline  (VIBRA -TABS) 100 MG tablet    Sig: Take 1 tablet (100 mg total) by mouth 2 (two) times daily.    Dispense:  20 tablet    Refill:  0    Time spent:15 Minutes    Juliene DOROTHA Howells AGNP-C Nurse Practitioner

## 2024-07-13 ENCOUNTER — Encounter

## 2024-10-10 ENCOUNTER — Ambulatory Visit: Admitting: Physician Assistant

## 2025-02-09 ENCOUNTER — Encounter: Admitting: Physician Assistant
